# Patient Record
Sex: Female | Born: 1975 | Hispanic: No | Marital: Married | Smoking: Never smoker
Health system: Southern US, Community
[De-identification: ages and names within clinical notes are randomized; demographics above are authoritative.]

## PROBLEM LIST (undated history)

## (undated) DIAGNOSIS — N390 Urinary tract infection, site not specified: Secondary | ICD-10-CM

## (undated) DIAGNOSIS — Z915 Personal history of self-harm: Secondary | ICD-10-CM

---

## 1997-05-23 ENCOUNTER — Other Ambulatory Visit: Admission: RE | Admit: 1997-05-23 | Discharge: 1997-05-23 | Payer: Self-pay | Admitting: Gynecology

## 1997-06-29 ENCOUNTER — Encounter: Admission: RE | Admit: 1997-06-29 | Discharge: 1997-09-27 | Payer: Self-pay | Admitting: Gynecology

## 1997-09-30 ENCOUNTER — Ambulatory Visit (HOSPITAL_COMMUNITY): Admission: RE | Admit: 1997-09-30 | Discharge: 1997-09-30 | Payer: Self-pay | Admitting: Gynecology

## 1997-11-19 ENCOUNTER — Inpatient Hospital Stay (HOSPITAL_COMMUNITY): Admission: AD | Admit: 1997-11-19 | Discharge: 1997-11-22 | Payer: Self-pay | Admitting: Gynecology

## 1997-11-30 ENCOUNTER — Inpatient Hospital Stay (HOSPITAL_COMMUNITY): Admission: AD | Admit: 1997-11-30 | Discharge: 1997-11-30 | Payer: Self-pay | Admitting: Gynecology

## 1997-11-30 ENCOUNTER — Ambulatory Visit (HOSPITAL_COMMUNITY): Admission: RE | Admit: 1997-11-30 | Discharge: 1997-11-30 | Payer: Self-pay | Admitting: Urology

## 1998-02-07 ENCOUNTER — Other Ambulatory Visit: Admission: RE | Admit: 1998-02-07 | Discharge: 1998-02-07 | Payer: Self-pay | Admitting: Obstetrics and Gynecology

## 2001-08-09 ENCOUNTER — Emergency Department (HOSPITAL_COMMUNITY): Admission: EM | Admit: 2001-08-09 | Discharge: 2001-08-09 | Payer: Self-pay | Admitting: Emergency Medicine

## 2001-08-09 ENCOUNTER — Encounter: Payer: Self-pay | Admitting: Emergency Medicine

## 2005-06-26 ENCOUNTER — Encounter: Payer: Self-pay | Admitting: Urology

## 2006-11-17 ENCOUNTER — Emergency Department: Payer: Self-pay | Admitting: Emergency Medicine

## 2009-08-30 ENCOUNTER — Inpatient Hospital Stay (HOSPITAL_COMMUNITY): Admission: EM | Admit: 2009-08-30 | Discharge: 2009-09-03 | Payer: Self-pay | Admitting: Emergency Medicine

## 2009-08-30 DIAGNOSIS — Z8639 Personal history of other endocrine, nutritional and metabolic disease: Secondary | ICD-10-CM

## 2009-08-30 DIAGNOSIS — N12 Tubulo-interstitial nephritis, not specified as acute or chronic: Secondary | ICD-10-CM | POA: Insufficient documentation

## 2009-08-30 DIAGNOSIS — Z862 Personal history of diseases of the blood and blood-forming organs and certain disorders involving the immune mechanism: Secondary | ICD-10-CM

## 2009-09-02 LAB — CONVERTED CEMR LAB
HCT: 29.5 %
Hemoglobin: 10.3 g/dL
Hep B C IgM: NEGATIVE
Hepatitis B Surface Ag: NEGATIVE
MCHC: 32.1 g/dL
MCV: 91.7 fL
Platelets: 210 10*3/uL
RBC: 3.22 M/uL
RDW: 13.8 %
WBC: 7.6 10*3/uL

## 2009-09-03 LAB — CONVERTED CEMR LAB
AST: 39 units/L
Albumin: 2.8 g/dL
Bilirubin, Direct: 0.1 mg/dL
CO2: 26 meq/L
Glucose, Bld: 99 mg/dL
Potassium: 4.2 meq/L
Sodium: 140 meq/L
Total Bilirubin: 0.5 mg/dL

## 2009-09-15 ENCOUNTER — Encounter (INDEPENDENT_AMBULATORY_CARE_PROVIDER_SITE_OTHER): Payer: Self-pay | Admitting: Nurse Practitioner

## 2009-09-15 ENCOUNTER — Ambulatory Visit: Payer: Self-pay | Admitting: Internal Medicine

## 2009-09-15 DIAGNOSIS — E871 Hypo-osmolality and hyponatremia: Secondary | ICD-10-CM

## 2009-09-23 ENCOUNTER — Telehealth (INDEPENDENT_AMBULATORY_CARE_PROVIDER_SITE_OTHER): Payer: Self-pay | Admitting: Nurse Practitioner

## 2009-09-23 LAB — CONVERTED CEMR LAB
ALT: 24 units/L (ref 0–35)
AST: 24 units/L (ref 0–37)
Albumin: 4.1 g/dL (ref 3.5–5.2)
BUN: 7 mg/dL (ref 6–23)
CO2: 27 meq/L (ref 19–32)
Calcium: 9.2 mg/dL (ref 8.4–10.5)
Chloride: 104 meq/L (ref 96–112)
Creatinine, Ser: 0.69 mg/dL (ref 0.40–1.20)
HCV Ab: REACTIVE — AB
INR: 1.08 (ref <1.50)
Potassium: 4.5 meq/L (ref 3.5–5.3)
Prothrombin Time: 13.9 s (ref 11.6–15.2)

## 2010-03-27 NOTE — Assessment & Plan Note (Signed)
Summary: NEW - Establish care   Vital Signs:  Patient profile:   35 year old female LMP:     09/14/2009 Height:      60..25 inches Weight:      106.5 pounds Temp:     98.1 degrees F oral Pulse rate:   67 / minute Pulse rhythm:   regular Resp:     16 per minute BP sitting:   104 / 70  (left arm) Cuff size:   large  Vitals Entered By: Levon Hedger (September 15, 2009 3:30 PM) CC: follow-up visit [facility] Is Patient Diabetic? No Pain Assessment Patient in pain? no       Does patient need assistance? Functional Status Self care Ambulation Normal LMP (date): 09/14/2009     Enter LMP: 09/14/2009   CC:  follow-up visit [facility] [place].  History of Present Illness:  Pt into the office for hospital f/u Hospitalized from 08/30/2009 to 09/04/2009  no significant PMH no significant PSH  Pt reports that she presented to the ER with fever, nausea and vomiting.   CT scan showed enlarged left kidney and some dilation of the renal pelvis system, but no obvious stone. Pt was started on pyelonephritis.  Blood pressure dropped down into the 70's. She was started on zosyn and IV resuscitation.  Urine culture grew E. coli which is sensitive Cipro . She has taken cipro for 2 weeks and just finished her course on yesterday. Menses started on yesterday.  LFT"s elevated - ultrasound negative for cholecystitis. Alk phos is still elevated at 354 Hepatitis A negative Hepatits B negative Hepatitis C REACTIVE Consult with [facility] - Dr. Lyndal Rainbow and he recommended follow hepatitis panel and AMA outpt  Mitochondrial M2 Ab, IgG                 Negative  Pt is married for 5 years Mother has a hx of hepatitis B  when pt was in the 6th grade.  She was treated.  Family did NOT get vaccines. no tattoos no blood transfusion no IV drug use +nausea intermittently when eating fried and fatty foods no hx of jaundice  Habits & Providers  Alcohol-Tobacco-Diet     Alcohol drinks/day: 0     Tobacco Status:  never  Exercise-Depression-Behavior     Drug Use: never  Allergies (verified): 1)  ! * Vitamin C 2)  ! * Peanuts  Past History:  Past Surgical History: tubal ligation 2005  Family History: Father - healthy Mother - healthy  Social History: Originally from [place] 2 children married tobacco - none ETOH - none Drug - noneSmoking Status:  never Drug Use:  never  Review of Systems General:  Denies fever. CV:  Denies chest pain or discomfort. Resp:  Denies cough. GI:  Complains of nausea; denies abdominal pain and vomiting; especially when eating fried foods.  Physical Exam  General:  alert.  thin Head:  normocephalic.   Lungs:  normal breath sounds.   Heart:  normal rate.   Abdomen:  normal bowel sounds.   Msk:  normal ROM.   Neurologic:  alert & oriented X3.     Impression & Recommendations:  Problem # 1:  PYELONEPHRITIS (ICD-590.80) improved - pt has finished cipro unable to re-check urine today as pt is on her menses Orders: UA Dipstick w/o Micro (manual) (60454)  Problem # 2:  LIVER FUNCTION TESTS, ABNORMAL, HX OF (ICD-V12.2) Hepatitis C reactive in hospital will check additional labs today no know hx of the  disease Orders: T-Comprehensive Metabolic Panel [id number]) T-Hepatitis C Antibody ([id number]) T-Hepatitis C Viral Load [id number]) T-Hepattis C Genotype, DNA ([id number]) T-Protime, Auto ([id number])  Patient Instructions: 1)  Schedule an eligibility appointment 2)  Schedule follow up appointment after eligibility. 3)  Your initial hepatitis C lab done in the hospital was abnormal.  Will do additional labs today in office to determine if you have the infection.   4)   Mitochondrial Negative (no further workup needed) 5)  Will not recheck urine today since you are on your cycle   CT of Abdomen  Procedure date:  07/31/2009  Findings:      diffuse enlargment of left kidney with mild associated pelviectasis and distention of the  renal pelvis.  No obstructing ureteral density, urinary tract calculus demostrated  Renal US  Procedure date:  08/30/2009  Findings:      Asymmetric kidney size, left kidney enlarged maybe hyperemic; diffuse wall thickening of the gallbladder without stones; small amount of free fluid in the right upper quadrant and fluid in the right lower quadrant   CT of Abdomen  Procedure date:  07/31/2009  Findings:      diffuse enlargment of left kidney with mild associated pelviectasis and distention of the renal pelvis.  No obstructing ureteral density, urinary tract calculus demostrated  Renal US  Procedure date:  08/30/2009  Findings:      Asymmetric kidney size, left kidney enlarged maybe hyperemic; diffuse wall thickening of the gallbladder without stones; small amount of free fluid in the right upper quadrant and fluid in the right lower quadrant   Laboratory Results  Comments: pt did not give a urine sample because she is on her menses

## 2010-03-27 NOTE — Letter (Signed)
Summary: PT INFORMATION SHEET  PT INFORMATION SHEET   Imported By: Arta Bruce 09/18/2009 14:59:21  _____________________________________________________________________  External Attachment:    Type:   Image     Comment:   External Document

## 2010-03-27 NOTE — Progress Notes (Signed)
Summary: Needs eligibilty appt  Phone Note Outgoing Call   Summary of Call: does this pt have an eligibility appt? if not, she needs to be contacted and scheduled for one because she needs a f/u appt with me I will need to see her for review of labs once she has on orange card be sure she has a list of what she needs to take with her to the eligibility appt Initial call taken by: Lehman Prom FNP,  September 23, 2009 6:41 PM  Follow-up for Phone Call        left message with person that answered to get a message to the pt to return call to the offfice because pt is scheduled on 10-04-09 at 10:45 for eligibility and needs to be made aware of this date and time.   Levon Hedger  September 26, 2009 3:48 PM  Levon Hedger  October 02, 2009 10:36 AM Left message on machcine for pt to return call to the office.  Levon Hedger  October 02, 2009 5:25 PM per Selena Batten pt informed of above information.

## 2010-05-13 LAB — CARDIAC PANEL(CRET KIN+CKTOT+MB+TROPI)
CK, MB: 0.4 ng/mL (ref 0.3–4.0)
CK, MB: 0.5 ng/mL (ref 0.3–4.0)
CK, MB: 0.5 ng/mL (ref 0.3–4.0)
CK, MB: 0.5 ng/mL (ref 0.3–4.0)
Relative Index: INVALID (ref 0.0–2.5)
Relative Index: INVALID (ref 0.0–2.5)
Total CK: 44 U/L (ref 7–177)
Troponin I: 0.01 ng/mL (ref 0.00–0.06)

## 2010-05-13 LAB — URINALYSIS, ROUTINE W REFLEX MICROSCOPIC
Glucose, UA: NEGATIVE mg/dL
Protein, ur: 100 mg/dL — AB
pH: 5.5 (ref 5.0–8.0)

## 2010-05-13 LAB — DIFFERENTIAL
Basophils Absolute: 0 10*3/uL (ref 0.0–0.1)
Basophils Relative: 0 % (ref 0–1)
Basophils Relative: 0 % (ref 0–1)
Eosinophils Absolute: 0 10*3/uL (ref 0.0–0.7)
Eosinophils Relative: 0 % (ref 0–5)
Monocytes Absolute: 0.3 10*3/uL (ref 0.1–1.0)
Monocytes Relative: 5 % (ref 3–12)
Neutro Abs: 5.6 10*3/uL (ref 1.7–7.7)
Neutro Abs: 5.7 10*3/uL (ref 1.7–7.7)
Neutrophils Relative %: 85 % — ABNORMAL HIGH (ref 43–77)

## 2010-05-13 LAB — COMPREHENSIVE METABOLIC PANEL
ALT: 82 U/L — ABNORMAL HIGH (ref 0–35)
AST: 38 U/L — ABNORMAL HIGH (ref 0–37)
Alkaline Phosphatase: 252 U/L — ABNORMAL HIGH (ref 39–117)
CO2: 26 mEq/L (ref 19–32)
Chloride: 111 mEq/L (ref 96–112)
Creatinine, Ser: 0.67 mg/dL (ref 0.4–1.2)
GFR calc Af Amer: >60 mL/min (ref >60)
GFR calc non Af Amer: >60 mL/min (ref >60)
Potassium: 4.2 mEq/L (ref 3.5–5.1)
Sodium: 140 mEq/L (ref 135–145)
Total Bilirubin: 0.6 mg/dL (ref 0.3–1.2)

## 2010-05-13 LAB — URINE CULTURE: Colony Count: >=100000

## 2010-05-13 LAB — BASIC METABOLIC PANEL
BUN: 3 mg/dL — ABNORMAL LOW (ref 6–23)
BUN: 6 mg/dL (ref 6–23)
CO2: 25 mEq/L (ref 19–32)
CO2: 30 mEq/L (ref 19–32)
Calcium: 6.7 mg/dL — ABNORMAL LOW (ref 8.4–10.5)
Chloride: 95 mEq/L — ABNORMAL LOW (ref 96–112)
GFR calc non Af Amer: >60 mL/min (ref >60)
GFR calc non Af Amer: >60 mL/min (ref >60)
Glucose, Bld: 127 mg/dL — ABNORMAL HIGH (ref 70–99)
Glucose, Bld: 93 mg/dL (ref 70–99)
Glucose, Bld: 98 mg/dL (ref 70–99)
Potassium: 2.6 mEq/L — CL (ref 3.5–5.1)
Potassium: 3.5 mEq/L (ref 3.5–5.1)
Sodium: 131 mEq/L — ABNORMAL LOW (ref 135–145)
Sodium: 138 mEq/L (ref 135–145)
Sodium: 140 mEq/L (ref 135–145)

## 2010-05-13 LAB — URINE MICROSCOPIC-ADD ON

## 2010-05-13 LAB — HEPATIC FUNCTION PANEL
ALT: 85 U/L — ABNORMAL HIGH (ref 0–35)
AST: 48 U/L — ABNORMAL HIGH (ref 0–37)
Albumin: 2.2 g/dL — ABNORMAL LOW (ref 3.5–5.2)
Albumin: 2.8 g/dL — ABNORMAL LOW (ref 3.5–5.2)
Alkaline Phosphatase: 354 U/L — ABNORMAL HIGH (ref 39–117)
Total Protein: 4.6 g/dL — ABNORMAL LOW (ref 6.0–8.3)
Total Protein: 6.8 g/dL (ref 6.0–8.3)

## 2010-05-13 LAB — LIPASE, BLOOD: Lipase: 27 U/L (ref 11–59)

## 2010-05-13 LAB — CBC
HCT: 31.2 % — ABNORMAL LOW (ref 36.0–46.0)
HCT: 37.3 % (ref 36.0–46.0)
Hemoglobin: 10.3 g/dL — ABNORMAL LOW (ref 12.0–15.0)
Hemoglobin: 10.6 g/dL — ABNORMAL LOW (ref 12.0–15.0)
Hemoglobin: 12.7 g/dL (ref 12.0–15.0)
MCH: 31.4 pg (ref 26.0–34.0)
MCH: 31.6 pg (ref 26.0–34.0)
MCH: 32.1 pg (ref 26.0–34.0)
MCHC: 34 g/dL (ref 30.0–36.0)
MCHC: 34 g/dL (ref 30.0–36.0)
MCHC: 34.1 g/dL (ref 30.0–36.0)
MCV: 92.9 fL (ref 78.0–100.0)
RBC: 3.22 MIL/uL — ABNORMAL LOW (ref 3.87–5.11)
RDW: 13.8 % (ref 11.5–15.5)
RDW: 14.3 % (ref 11.5–15.5)
WBC: 7.6 10*3/uL (ref 4.0–10.5)

## 2010-05-13 LAB — MRSA PCR SCREENING: MRSA by PCR: NEGATIVE

## 2010-05-13 LAB — CULTURE, BLOOD (ROUTINE X 2): Culture: NO GROWTH

## 2010-05-13 LAB — MAGNESIUM: Magnesium: 1.6 mg/dL (ref 1.5–2.5)

## 2011-01-01 ENCOUNTER — Encounter: Payer: Self-pay | Admitting: Emergency Medicine

## 2011-01-01 ENCOUNTER — Emergency Department (HOSPITAL_COMMUNITY)
Admission: EM | Admit: 2011-01-01 | Discharge: 2011-01-01 | Disposition: A | Discharge status: Home / Self Care | Payer: Self-pay | Attending: Emergency Medicine | Admitting: Emergency Medicine

## 2011-01-01 DIAGNOSIS — N12 Tubulo-interstitial nephritis, not specified as acute or chronic: Secondary | ICD-10-CM | POA: Insufficient documentation

## 2011-01-01 DIAGNOSIS — R300 Dysuria: Secondary | ICD-10-CM | POA: Insufficient documentation

## 2011-01-01 DIAGNOSIS — R350 Frequency of micturition: Secondary | ICD-10-CM | POA: Insufficient documentation

## 2011-01-01 DIAGNOSIS — R10819 Abdominal tenderness, unspecified site: Secondary | ICD-10-CM | POA: Insufficient documentation

## 2011-01-01 DIAGNOSIS — R112 Nausea with vomiting, unspecified: Secondary | ICD-10-CM | POA: Insufficient documentation

## 2011-01-01 DIAGNOSIS — R6883 Chills (without fever): Secondary | ICD-10-CM | POA: Insufficient documentation

## 2011-01-01 DIAGNOSIS — R109 Unspecified abdominal pain: Secondary | ICD-10-CM | POA: Insufficient documentation

## 2011-01-01 HISTORY — DX: Urinary tract infection, site not specified: N39.0

## 2011-01-01 LAB — DIFFERENTIAL
Eosinophils Absolute: 0 10*3/uL (ref 0.0–0.7)
Lymphocytes Relative: 9 % — ABNORMAL LOW (ref 12–46)
Lymphs Abs: 0.8 10*3/uL (ref 0.7–4.0)
Monocytes Relative: 6 % (ref 3–12)
Neutrophils Relative %: 85 % — ABNORMAL HIGH (ref 43–77)

## 2011-01-01 LAB — URINE MICROSCOPIC-ADD ON

## 2011-01-01 LAB — URINALYSIS, ROUTINE W REFLEX MICROSCOPIC
Bilirubin Urine: NEGATIVE
Nitrite: NEGATIVE
Specific Gravity, Urine: 1.02 (ref 1.005–1.030)
pH: 7.5 (ref 5.0–8.0)

## 2011-01-01 LAB — POCT I-STAT, CHEM 8
BUN: 3 mg/dL — ABNORMAL LOW (ref 6–23)
Chloride: 98 mEq/L (ref 96–112)
Creatinine, Ser: 0.8 mg/dL (ref 0.50–1.10)
Sodium: 134 mEq/L — ABNORMAL LOW (ref 135–145)
TCO2: 23 mmol/L (ref 0–100)

## 2011-01-01 LAB — CBC
Hemoglobin: 13.3 g/dL (ref 12.0–15.0)
MCH: 30.6 pg (ref 26.0–34.0)
MCV: 89.4 fL (ref 78.0–100.0)
RBC: 4.35 MIL/uL (ref 3.87–5.11)
WBC: 9 10*3/uL (ref 4.0–10.5)

## 2011-01-01 LAB — PREGNANCY, URINE: Preg Test, Ur: NEGATIVE

## 2011-01-01 MED ORDER — DEXTROSE 5 % IV SOLN
1.0000 g | Freq: Once | INTRAVENOUS | Status: AC
  Administered 2011-01-01: 1 g via INTRAVENOUS
  Filled 2011-01-01: qty 10

## 2011-01-01 MED ORDER — CIPROFLOXACIN HCL 500 MG PO TABS: 500.0000 mg | ORAL_TABLET | Freq: Two times a day (BID) | ORAL | Status: AC

## 2011-01-01 MED ORDER — HYDROCODONE-ACETAMINOPHEN 5-500 MG PO TABS: 1.0000 | ORAL_TABLET | Freq: Four times a day (QID) | ORAL | Status: AC | PRN

## 2011-01-01 MED ORDER — CEPHALEXIN 500 MG PO CAPS: 500.0000 mg | ORAL_CAPSULE | Freq: Four times a day (QID) | ORAL | Status: AC

## 2011-01-01 MED ORDER — FENTANYL CITRATE 0.05 MG/ML IJ SOLN
50.0000 ug | Freq: Once | INTRAMUSCULAR | Status: AC
  Administered 2011-01-01: 50 ug via INTRAVENOUS
  Filled 2011-01-01: qty 2

## 2011-01-01 MED ORDER — ONDANSETRON HCL 4 MG/2ML IJ SOLN
4.0000 mg | Freq: Once | INTRAMUSCULAR | Status: AC
  Administered 2011-01-01: 4 mg via INTRAVENOUS
  Filled 2011-01-01: qty 2

## 2011-01-01 MED ORDER — SODIUM CHLORIDE 0.9 % IV BOLUS (SEPSIS)
1000.0000 mL | Freq: Once | INTRAVENOUS | Status: AC
  Administered 2011-01-01: 1000 mL via INTRAVENOUS

## 2011-01-01 MED ORDER — ACETAMINOPHEN 500 MG PO TABS
1000.0000 mg | ORAL_TABLET | Freq: Once | ORAL | Status: AC
  Administered 2011-01-01: 1000 mg via ORAL
  Filled 2011-01-01: qty 1

## 2011-01-01 MED ORDER — SODIUM CHLORIDE 0.9 % IV BOLUS (SEPSIS)
1000.0000 mL | Freq: Once | INTRAVENOUS | Status: AC
  Administered 2011-01-01 (×2): 1000 mL via INTRAVENOUS

## 2011-01-01 NOTE — ED Notes (Signed)
Pt temp elevated: 102 F oral

## 2011-01-01 NOTE — ED Provider Notes (Deleted)
History     CSN: [id number] Arrival date & time: 01/01/2011 12:59 PM   First MD Initiated Contact with Patient 01/01/11 1327      Chief Complaint  Patient presents with  . Urinary Frequency  . Urinary Tract Infection    (Consider location/radiation/quality/duration/timing/severity/associated sxs/prior treatment) HPI  Past Medical History  Diagnosis Date  . UTI (urinary tract infection)     History reviewed. No pertinent past surgical history.  No family history on file.  History  Substance Use Topics  . Smoking status: Not on file  . Smokeless tobacco: Not on file  . Alcohol Use: Yes    OB History    Grav Para Term Preterm Abortions TAB SAB Ect Mult Living                  Review of Systems  Allergies  Peanut-containing drug products  Home Medications   Current Outpatient Rx  Name Route Sig Dispense Refill  . ACETAMINOPHEN 500 MG PO TABS Oral Take 1,000 mg by mouth every 6 (six) hours as needed. For pain     . THERA M PLUS PO TABS Oral Take 1 tablet by mouth 2 (two) times daily.      . CEPHALEXIN 500 MG PO CAPS Oral Take 1 capsule (500 mg total) by mouth 4 (four) times daily. 40 capsule 0  . CIPROFLOXACIN HCL 500 MG PO TABS Oral Take 1 tablet (500 mg total) by mouth 2 (two) times daily. 20 tablet 0  . HYDROCODONE-ACETAMINOPHEN 5-500 MG PO TABS Oral Take 1-2 tablets by mouth every 6 (six) hours as needed for pain. 15 tablet 0    BP 92/54  Pulse 106  Temp(Src) 98.7 F (37.1 C) (Oral)  Resp 20  Wt 108 lb (48.988 kg)  SpO2 100%  Physical Exam  ED Course  Procedures (including critical care time)  Labs Reviewed  URINALYSIS, ROUTINE W REFLEX MICROSCOPIC - Abnormal; Notable for the following:    Appearance CLOUDY (*)    Hgb urine dipstick SMALL (*)    Protein, ur 100 (*)    Leukocytes, UA LARGE (*)    All other components within normal limits  DIFFERENTIAL - Abnormal; Notable for the following:    Neutrophils Relative 85 (*)    Lymphocytes  Relative 9 (*)    All other components within normal limits  POCT I-STAT, CHEM 8 - Abnormal; Notable for the following:    Sodium 134 (*)    BUN 3 (*)    Glucose, Bld 117 (*)    All other components within normal limits  URINE MICROSCOPIC-ADD ON - Abnormal; Notable for the following:    Squamous Epithelial / LPF FEW (*)    Bacteria, UA FEW (*)    All other components within normal limits  CBC  PREGNANCY, URINE  I-STAT, CHEM 8   No results found.   1. Pyelonephritis       MDM  Pyelonephritis. Pt treated and improved in ED.Flint Melter, MD 01/01/11 2037  Flint Melter, MD 01/01/11 2039

## 2011-01-01 NOTE — ED Notes (Signed)
Pt reports frequent UTIs and began having frequency, urgency, pain with urination on Sunday. Yesterday began having nausea, fever of 101 at home. Denies blood in urine. No treatment at home.

## 2011-01-01 NOTE — ED Notes (Signed)
Pt states that she has a lot of uti and that she has lower back pain, n/v, fever for the past 2 days
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
## 2011-01-04 NOTE — ED Provider Notes (Signed)
Medical screening examination/treatment/procedure(s) were performed by non-physician practitioner and as supervising physician I was immediately available for consultation/collaboration.  Flint Melter, MD 01/04/11 [phone]

## 2011-02-01 ENCOUNTER — Other Ambulatory Visit (HOSPITAL_COMMUNITY): Payer: Self-pay | Admitting: Urology

## 2011-02-01 DIAGNOSIS — N390 Urinary tract infection, site not specified: Secondary | ICD-10-CM

## 2011-02-04 ENCOUNTER — Inpatient Hospital Stay (HOSPITAL_COMMUNITY): Admission: RE | Admit: 2011-02-04 | Payer: Self-pay | Source: Ambulatory Visit

## 2011-04-26 ENCOUNTER — Emergency Department: Payer: Self-pay | Admitting: Emergency Medicine

## 2011-04-26 LAB — CBC WITH DIFFERENTIAL/PLATELET
Basophil %: 1.6 %
Eosinophil #: 0.2 10*3/uL (ref 0.0–0.7)
Eosinophil %: 3.1 %
HGB: 13.4 g/dL (ref 12.0–16.0)
Lymphocyte %: 25 %
Neutrophil %: 61 %
RBC: 4.43 10*6/uL (ref 3.80–5.20)

## 2011-04-26 LAB — COMPREHENSIVE METABOLIC PANEL
Anion Gap: 11 (ref 7–16)
Calcium, Total: 9.1 mg/dL (ref 8.5–10.1)
Chloride: 104 mmol/L (ref 98–107)
Co2: 27 mmol/L (ref 21–32)
EGFR (African American): >60
EGFR (Non-African Amer.): >60
Glucose: 90 mg/dL (ref 65–99)
Potassium: 3.8 mmol/L (ref 3.5–5.1)
SGOT(AST): 93 U/L — ABNORMAL HIGH (ref 15–37)

## 2011-04-26 LAB — PROTIME-INR
INR: 1
Prothrombin Time: 13.2 secs (ref 11.5–14.7)

## 2011-04-26 LAB — APTT: Activated PTT: 26.3 secs (ref 23.6–35.9)
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
## 2014-01-25 DIAGNOSIS — Z9151 Personal history of suicidal behavior: Secondary | ICD-10-CM

## 2014-01-25 HISTORY — DX: Personal history of suicidal behavior: Z91.51

## 2014-10-03 ENCOUNTER — Encounter (HOSPITAL_COMMUNITY): Payer: Self-pay | Admitting: Emergency Medicine

## 2014-10-03 ENCOUNTER — Emergency Department (HOSPITAL_COMMUNITY)
Admission: EM | Admit: 2014-10-03 | Discharge: 2014-10-04 | Disposition: A | Discharge status: Other Acute Inpatient Hospital | Payer: Self-pay | Attending: Emergency Medicine | Admitting: Emergency Medicine

## 2014-10-03 DIAGNOSIS — T6092XA Toxic effect of unspecified pesticide, intentional self-harm, initial encounter: Secondary | ICD-10-CM | POA: Insufficient documentation

## 2014-10-03 DIAGNOSIS — Y998 Other external cause status: Secondary | ICD-10-CM | POA: Insufficient documentation

## 2014-10-03 DIAGNOSIS — Z3202 Encounter for pregnancy test, result negative: Secondary | ICD-10-CM | POA: Insufficient documentation

## 2014-10-03 DIAGNOSIS — F10921 Alcohol use, unspecified with intoxication delirium: Secondary | ICD-10-CM

## 2014-10-03 DIAGNOSIS — T405X4A Poisoning by cocaine, undetermined, initial encounter: Secondary | ICD-10-CM | POA: Insufficient documentation

## 2014-10-03 DIAGNOSIS — T50904A Poisoning by unspecified drugs, medicaments and biological substances, undetermined, initial encounter: Secondary | ICD-10-CM

## 2014-10-03 DIAGNOSIS — Y92009 Unspecified place in unspecified non-institutional (private) residence as the place of occurrence of the external cause: Secondary | ICD-10-CM | POA: Insufficient documentation

## 2014-10-03 DIAGNOSIS — T1491 Suicide attempt: Secondary | ICD-10-CM | POA: Insufficient documentation

## 2014-10-03 DIAGNOSIS — T6591XA Toxic effect of unspecified substance, accidental (unintentional), initial encounter: Secondary | ICD-10-CM

## 2014-10-03 DIAGNOSIS — F10121 Alcohol abuse with intoxication delirium: Secondary | ICD-10-CM | POA: Insufficient documentation

## 2014-10-03 DIAGNOSIS — T424X4A Poisoning by benzodiazepines, undetermined, initial encounter: Secondary | ICD-10-CM | POA: Insufficient documentation

## 2014-10-03 DIAGNOSIS — Z87440 Personal history of urinary (tract) infections: Secondary | ICD-10-CM | POA: Insufficient documentation

## 2014-10-03 DIAGNOSIS — Y9389 Activity, other specified: Secondary | ICD-10-CM | POA: Insufficient documentation

## 2014-10-03 DIAGNOSIS — T1491XA Suicide attempt, initial encounter: Secondary | ICD-10-CM

## 2014-10-03 HISTORY — DX: Personal history of self-harm: Z91.5

## 2014-10-03 LAB — COMPREHENSIVE METABOLIC PANEL
ALT: 35 U/L (ref 14–54)
AST: 29 U/L (ref 15–41)
Albumin: 3.6 g/dL (ref 3.5–5.0)
Alkaline Phosphatase: 89 U/L (ref 38–126)
Anion gap: 9 (ref 5–15)
BUN: <5 mg/dL — ABNORMAL LOW (ref 6–20)
CO2: 26 mmol/L (ref 22–32)
CREATININE: 0.81 mg/dL (ref 0.44–1.00)
Calcium: 8.8 mg/dL — ABNORMAL LOW (ref 8.9–10.3)
Chloride: 107 mmol/L (ref 101–111)
Glucose, Bld: 98 mg/dL (ref 65–99)
POTASSIUM: 3.4 mmol/L — AB (ref 3.5–5.1)
SODIUM: 142 mmol/L (ref 135–145)
Total Bilirubin: 0.5 mg/dL (ref 0.3–1.2)
Total Protein: 6.9 g/dL (ref 6.5–8.1)

## 2014-10-03 LAB — I-STAT ARTERIAL BLOOD GAS, ED
Acid-base deficit: 2 mmol/L (ref 0.0–2.0)
Bicarbonate: 24.4 mEq/L — ABNORMAL HIGH (ref 20.0–24.0)
O2 SAT: 100 %
PH ART: 7.35 (ref 7.350–7.450)
PO2 ART: 295 mmHg — AB (ref 80.0–100.0)
Patient temperature: 98.1
TCO2: 26 mmol/L (ref 0–100)
pCO2 arterial: 44 mmHg (ref 35.0–45.0)

## 2014-10-03 LAB — CBC WITH DIFFERENTIAL/PLATELET
Basophils Absolute: 0 10*3/uL (ref 0.0–0.1)
Basophils Relative: 0 % (ref 0–1)
EOS ABS: 0.1 10*3/uL (ref 0.0–0.7)
Eosinophils Relative: 2 % (ref 0–5)
HEMATOCRIT: 40.9 % (ref 36.0–46.0)
Hemoglobin: 14 g/dL (ref 12.0–15.0)
Lymphocytes Relative: 55 % — ABNORMAL HIGH (ref 12–46)
Lymphs Abs: 2.2 10*3/uL (ref 0.7–4.0)
MCH: 29.5 pg (ref 26.0–34.0)
MCHC: 34.2 g/dL (ref 30.0–36.0)
MCV: 86.1 fL (ref 78.0–100.0)
MONOS PCT: 9 % (ref 3–12)
Monocytes Absolute: 0.3 10*3/uL (ref 0.1–1.0)
NEUTROS ABS: 1.4 10*3/uL — AB (ref 1.7–7.7)
Neutrophils Relative %: 35 % — ABNORMAL LOW (ref 43–77)
Platelets: 260 10*3/uL (ref 150–400)
RBC: 4.75 MIL/uL (ref 3.87–5.11)
RDW: 15.1 % (ref 11.5–15.5)
WBC: 3.9 10*3/uL — ABNORMAL LOW (ref 4.0–10.5)

## 2014-10-03 LAB — URINALYSIS, ROUTINE W REFLEX MICROSCOPIC
BILIRUBIN URINE: NEGATIVE
Glucose, UA: NEGATIVE mg/dL
Hgb urine dipstick: NEGATIVE
KETONES UR: NEGATIVE mg/dL
Nitrite: POSITIVE — AB
PROTEIN: NEGATIVE mg/dL
SPECIFIC GRAVITY, URINE: 1.01 (ref 1.005–1.030)
UROBILINOGEN UA: 0.2 mg/dL (ref 0.0–1.0)
pH: 5.5 (ref 5.0–8.0)

## 2014-10-03 LAB — RAPID URINE DRUG SCREEN, HOSP PERFORMED
Amphetamines: NOT DETECTED
Barbiturates: NOT DETECTED
Benzodiazepines: POSITIVE — AB
Cocaine: NOT DETECTED
Opiates: NOT DETECTED
Tetrahydrocannabinol: POSITIVE — AB

## 2014-10-03 LAB — URINE MICROSCOPIC-ADD ON

## 2014-10-03 LAB — POC URINE PREG, ED: PREG TEST UR: NEGATIVE

## 2014-10-03 LAB — CBG MONITORING, ED: Glucose-Capillary: 73 mg/dL (ref 65–99)

## 2014-10-03 LAB — ETHANOL: ALCOHOL ETHYL (B): 180 mg/dL — AB (ref <5)

## 2014-10-03 LAB — ACETAMINOPHEN LEVEL: Acetaminophen (Tylenol), Serum: <10 ug/mL — ABNORMAL LOW (ref 10–30)

## 2014-10-03 LAB — SALICYLATE LEVEL: Salicylate Lvl: <4 mg/dL (ref 2.8–30.0)

## 2014-10-03 MED ORDER — NALOXONE HCL 0.4 MG/ML IJ SOLN
INTRAMUSCULAR | Status: AC
Start: 2014-10-03 — End: 2014-10-03
  Administered 2014-10-03: 0.4 mg
  Filled 2014-10-03: qty 1
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
## 2014-10-03 NOTE — ED Provider Notes (Signed)
Seen on arrival Level V caveat history is taken from EMS. EMS reports patient ingested raid, "licked to the Can" prior to coming here and drank one bottle of wine and attempt to harm herself. EMS arrived to find patient unresponsive. Pulse oximetry on room air dropped to 83% in EMS present. EMS treated patient with nasal trumpet and supplemental oxygen. On exam patient is arousable to tactile stimulus. Follow some simple commands. Glasgow coma score equals 8-9 Gag reflex present but weak. Moves all extremities pupils 2-3 mm, reactive to light. Skin warm dry no rash. Patient admits her Narcan, without improvement of level of alertness  11:50 PM patient is alert admits to drinking rate and effort to harm herself she has had suicide attempts in the past. She is alert to go, score 15 and relates without difficulty. Results for orders placed or performed during the hospital encounter of 10/03/14  CBC with Differential  Result Value Ref Range   WBC 3.9 (L) 4.0 - 10.5 K/uL   RBC 4.75 3.87 - 5.11 MIL/uL   Hemoglobin 14.0 12.0 - 15.0 g/dL   HCT 11.9 14.7 - 82.9 %   MCV 86.1 78.0 - 100.0 fL   MCH 29.5 26.0 - 34.0 pg   MCHC 34.2 30.0 - 36.0 g/dL   RDW 56.2 13.0 - 86.5 %   Platelets 260 150 - 400 K/uL   Neutrophils Relative % 35 (L) 43 - 77 %   Neutro Abs 1.4 (L) 1.7 - 7.7 K/uL   Lymphocytes Relative 55 (H) 12 - 46 %   Lymphs Abs 2.2 0.7 - 4.0 K/uL   Monocytes Relative 9 3 - 12 %   Monocytes Absolute 0.3 0.1 - 1.0 K/uL   Eosinophils Relative 2 0 - 5 %   Eosinophils Absolute 0.1 0.0 - 0.7 K/uL   Basophils Relative 0 0 - 1 %   Basophils Absolute 0.0 0.0 - 0.1 K/uL  Comprehensive metabolic panel  Result Value Ref Range   Sodium 142 135 - 145 mmol/L   Potassium 3.4 (L) 3.5 - 5.1 mmol/L   Chloride 107 101 - 111 mmol/L   CO2 26 22 - 32 mmol/L   Glucose, Bld 98 65 - 99 mg/dL   BUN <5 (L) 6 - 20 mg/dL   Creatinine, Ser 7.84 0.44 - 1.00 mg/dL   Calcium 8.8 (L) 8.9 - 10.3 mg/dL   Total Protein 6.9 6.5 -  8.1 g/dL   Albumin 3.6 3.5 - 5.0 g/dL   AST 29 15 - 41 U/L   ALT 35 14 - 54 U/L   Alkaline Phosphatase 89 38 - 126 U/L   Total Bilirubin 0.5 0.3 - 1.2 mg/dL   GFR calc non Af Amer >60 >60 mL/min   GFR calc Af Amer >60 >60 mL/min   Anion gap 9 5 - 15  Urinalysis, Routine w reflex microscopic  Result Value Ref Range   Color, Urine YELLOW YELLOW   APPearance CLEAR CLEAR   Specific Gravity, Urine 1.010 1.005 - 1.030   pH 5.5 5.0 - 8.0   Glucose, UA NEGATIVE NEGATIVE mg/dL   Hgb urine dipstick NEGATIVE NEGATIVE   Bilirubin Urine NEGATIVE NEGATIVE   Ketones, ur NEGATIVE NEGATIVE mg/dL   Protein, ur NEGATIVE NEGATIVE mg/dL   Urobilinogen, UA 0.2 0.0 - 1.0 mg/dL   Nitrite POSITIVE (A) NEGATIVE   Leukocytes, UA TRACE (A) NEGATIVE  Urine rapid drug screen (hosp performed)  Result Value Ref Range   Opiates NONE DETECTED NONE DETECTED  Cocaine NONE DETECTED NONE DETECTED   Benzodiazepines POSITIVE (A) NONE DETECTED   Amphetamines NONE DETECTED NONE DETECTED   Tetrahydrocannabinol POSITIVE (A) NONE DETECTED   Barbiturates NONE DETECTED NONE DETECTED  Ethanol  Result Value Ref Range   Alcohol, Ethyl (B) 180 (H) <5 mg/dL  Salicylate level  Result Value Ref Range   Salicylate Lvl <4.0 2.8 - 30.0 mg/dL  Acetaminophen level  Result Value Ref Range   Acetaminophen (Tylenol), Serum <10 (L) 10 - 30 ug/mL  Urine microscopic-add on  Result Value Ref Range   Squamous Epithelial / LPF RARE RARE   WBC, UA 3-6 <3 WBC/hpf   Bacteria, UA FEW (A) RARE  CBG monitoring, ED  Result Value Ref Range   Glucose-Capillary 73 65 - 99 mg/dL  POC urine preg, ED  Result Value Ref Range   Preg Test, Ur NEGATIVE NEGATIVE  I-Stat arterial blood gas, ED  Result Value Ref Range   pH, Arterial 7.350 7.350 - 7.450   pCO2 arterial 44.0 35.0 - 45.0 mmHg   pO2, Arterial 295.0 (H) 80.0 - 100.0 mmHg   Bicarbonate 24.4 (H) 20.0 - 24.0 mEq/L   TCO2 26 0 - 100 mmol/L   O2 Saturation 100.0 %   Acid-base deficit  2.0 0.0 - 2.0 mmol/L   Patient temperature 98.1 F    Collection site RADIAL, ALLEN'S TEST ACCEPTABLE    Drawn by Operator    Sample type ARTERIAL    No results found.  CRITICAL CARE Performed by: Doug Sou Total critical care time: 35 minute Critical care time was exclusive of separately billable procedures and treating other patients. Critical care was necessary to treat or prevent imminent or life-threatening deterioration. Critical care was time spent personally by me on the following activities: development of treatment plan with patient and/or surrogate as well as nursing, discussions with consultants, evaluation of patient's response to treatment, examination of patient, obtaining history from patient or surrogate, ordering and performing treatments and interventions, ordering and review of laboratory studies, ordering and review of radiographic studies, pulse oximetry and re-evaluation of patient's condition.  Doug Sou, MD 10/03/14 [phone]

## 2014-10-03 NOTE — ED Provider Notes (Signed)
History   Chief Complaint  Patient presents with  . Poisoning    HPI [age] female with past medical history of chronic psychiatric illness, prior suicide attempts, who presents to ED via EMS for evaluation of ingestion. Per EMS report, patient ingested raid at home and was also drinking alcohol. Unknown whether patient had other coingestants. EMS does not know patient's home medications. They report patient has been unresponsive since they picked her up. She's been hemodynamically stable until shortly before arriving to ED if patient desaturated to 82% they placed a NPA and nonrebreather with normalization of oxygenation. EMS reports patient has been drinking wine all day per report. History is limited at this time secondary to patient condition and no family members being present.  Past medical/surgical history, social history, medications, allergies and FH have been reviewed with patient and/or in documentation. Furthermore, if pt family or friend(s) present, additional historical information was obtained from them.  Past Medical History  Diagnosis Date  . UTI (urinary tract infection)    No past surgical history on file. No family history on file. History  Substance Use Topics  . Smoking status: Not on file  . Smokeless tobacco: Not on file  . Alcohol Use: Yes     Review of Systems Unable to obtain secondary to patient condition   Physical Exam  Physical Exam ED Triage Vitals  Enc Vitals Group     BP 10/03/14 2029 121/86 mmHg     Pulse Rate 10/03/14 2029 85     Resp 10/03/14 2029 23     Temp 10/03/14 2029 97.9 F (36.6 C)     Temp Source 10/03/14 2029 Oral     SpO2 10/03/14 2024 98 %     Weight 10/03/14 2029 108 lb (48.988 kg)     Height 10/03/14 2029  (1.626 m)     Head Cir --      Peak Flow --      Pain Score --      Pain Loc --      Pain Edu? --      Excl. in GC? --    Constitutional: pt somnolent on arrival, minimally responsive. Opens eyes to pain. NPA  and NRB in place. No obvious distress. Head: Normocephalic and atraumatic.  Eyes: Extraocular motion intact, no scleral icterus Mouth: MMM, OP clear Neck: Supple without meningismus, mass, or overt JVD Respiratory: No respiratory distress. Normal WOB. No w/r/g. CV: RRR, no obvious murmurs.  Pulses +2 and symmetric. Euvolemic Abdomen: Soft, NT, ND, no r/g. No mass.  MSK: Extremities are atraumatic without deformity, ROM intact Skin: Warm, dry, intact without rash Neuro: MAE, poor tone, no focal deficit noted.   ED Course  Procedures   Labs Reviewed  CBC WITH DIFFERENTIAL/PLATELET - Abnormal; Notable for the following:    WBC 3.9 (*)    Neutrophils Relative % 35 (*)    Neutro Abs 1.4 (*)    Lymphocytes Relative 55 (*)    All other components within normal limits  COMPREHENSIVE METABOLIC PANEL - Abnormal; Notable for the following:    Potassium 3.4 (*)    BUN <5 (*)    Calcium 8.8 (*)    All other components within normal limits  URINALYSIS, ROUTINE W REFLEX MICROSCOPIC (NOT AT [facility]) - Abnormal; Notable for the following:    Nitrite POSITIVE (*)    Leukocytes, UA TRACE (*)    All other components within normal limits  URINE RAPID DRUG SCREEN, HOSP PERFORMED -  Abnormal; Notable for the following:    Benzodiazepines POSITIVE (*)    Tetrahydrocannabinol POSITIVE (*)    All other components within normal limits  ETHANOL - Abnormal; Notable for the following:    Alcohol, Ethyl (B) 180 (*)    All other components within normal limits  ACETAMINOPHEN LEVEL - Abnormal; Notable for the following:    Acetaminophen (Tylenol), Serum <10 (*)    All other components within normal limits  URINE MICROSCOPIC-ADD ON - Abnormal; Notable for the following:    Bacteria, UA FEW (*)    All other components within normal limits  I-STAT ARTERIAL BLOOD GAS, ED - Abnormal; Notable for the following:    pO2, Arterial 295.0 (*)    Bicarbonate 24.4 (*)    All other components within normal limits   SALICYLATE LEVEL  BLOOD GAS, ARTERIAL  CBG MONITORING, ED  POC URINE PREG, ED   I personally reviewed and interpreted all labs.  No results found. I personally viewed above image(s) which were used in my medical decision making. Formal interpretations by Radiology.  EKG: normal EKG, normal sinus rhythm, no acute ischemia, unchanged from previous tracings.  MDM: Vernal Rutan is a 39 y.o. female with H&P as above who p/w CC: Ingestion  On arrival, patient is somnolent but arousable. She is hemodynamically stable and her oxygen saturation is 100% on nonrebreather. Patient does have a NPA in place. Patient is speaking some but then subsequently falls back asleep. Initial workup included screening labs, EKG, 2 mg Narcan.  Following Narcan no change in patient's mental status. Discussed case with poison control and they feel that there is a very low likelihood of patient having some chest depression from raid as she was not drinking directly from the canister. They do report the patient could have hydrocarbon effects such as pulmonary edema and bronchospasms however. Post control was more concern for possible other coingestants. Patient does appear to be intoxicated from alcohol as well. Skin because of her CNS depression. Patient is currently protecting her airway and ABG was done which confirms no signs of CO2 retention.  Screening workup was notable for normal salicylate and Tylenol levels and positive EtOH level. UDS is positive for benzos and marijuana.  While in the patient is now awake and alert and oriented 3. HDS, AAOx4. PERRL, EOMI, TML, face sym. CN 2-12 grossly intact. 5/5 sym, no drift, SILT, normal gait and coordination.  She reports she was trying to harm herself but does not want to open up about this. She denies homicidal ideation. Denies hallucinations. She is without complaints and says she feels normal now. Patient subsequently denies any urinary symptoms and UA  findings are not consistent with UTI this time. Patient will be placed in psych hold and we'll get psych evaluation. She is now medically clear.  Old records reviewed (if available). Labs and imaging reviewed personally by myself and considered in medical decision making if ordered.  Clinical Impression: 1. Ingestion of nontoxic substance, initial encounter   2. Suicide attempt   3. Alcohol intoxication, with delirium   4. Polysubstance overdose, undetermined intent, initial encounter     Disposition: undetermined at this time (awaiting psych eval)  Condition: Good  Pt seen in conjunction with Dr. Doug Sou, MD  Ames Dura, DO [facility] Emergency Medicine Resident - PGY-3      Ames Dura, MD 10/04/14 [id number]  Doug Sou, MD 10/04/14 [id number]

## 2014-10-04 ENCOUNTER — Encounter (HOSPITAL_COMMUNITY): Payer: Self-pay | Admitting: *Deleted

## 2014-10-04 ENCOUNTER — Inpatient Hospital Stay (HOSPITAL_COMMUNITY)
Admission: AD | Admit: 2014-10-04 | Discharge: 2014-10-05 | DRG: 882 | Disposition: A | Discharge status: Home / Self Care | Payer: Federal, State, Local not specified - Other | Source: Intra-hospital | Attending: Psychiatry | Admitting: Psychiatry

## 2014-10-04 DIAGNOSIS — F4323 Adjustment disorder with mixed anxiety and depressed mood: Secondary | ICD-10-CM | POA: Diagnosis present

## 2014-10-04 DIAGNOSIS — T1491 Suicide attempt: Secondary | ICD-10-CM | POA: Diagnosis not present

## 2014-10-04 DIAGNOSIS — T65892A Toxic effect of other specified substances, intentional self-harm, initial encounter: Secondary | ICD-10-CM

## 2014-10-04 DIAGNOSIS — T1491XA Suicide attempt, initial encounter: Secondary | ICD-10-CM | POA: Diagnosis present

## 2014-10-04 MED ORDER — SODIUM CHLORIDE 0.9 % IV SOLN
1000.0000 mL | INTRAVENOUS | Status: DC
  Administered 2014-10-04: 1000 mL via INTRAVENOUS

## 2014-10-04 MED ORDER — AZITHROMYCIN 250 MG PO TABS
250.0000 mg | ORAL_TABLET | Freq: Two times a day (BID) | ORAL | Status: DC
  Administered 2014-10-04 – 2014-10-05 (×2): 250 mg via ORAL
  Filled 2014-10-04 (×6): qty 1

## 2014-10-04 MED ORDER — TRAZODONE HCL 50 MG PO TABS
50.0000 mg | ORAL_TABLET | Freq: Every evening | ORAL | Status: DC | PRN
  Filled 2014-10-04: qty 14

## 2014-10-04 MED ORDER — CIPROFLOXACIN HCL 500 MG PO TABS
500.0000 mg | ORAL_TABLET | Freq: Two times a day (BID) | ORAL | Status: DC
  Filled 2014-10-04 (×2): qty 1

## 2014-10-04 MED ORDER — SODIUM CHLORIDE 0.9 % IV SOLN
1000.0000 mL | Freq: Once | INTRAVENOUS | Status: AC
  Administered 2014-10-04: 1000 mL via INTRAVENOUS

## 2014-10-04 MED ORDER — POTASSIUM CHLORIDE CRYS ER 20 MEQ PO TBCR
20.0000 meq | EXTENDED_RELEASE_TABLET | Freq: Two times a day (BID) | ORAL | Status: AC
  Administered 2014-10-04 – 2014-10-05 (×2): 20 meq via ORAL
  Filled 2014-10-04 (×4): qty 1

## 2014-10-04 MED ORDER — ACETAMINOPHEN 325 MG PO TABS
650.0000 mg | ORAL_TABLET | Freq: Four times a day (QID) | ORAL | Status: DC | PRN
  Administered 2014-10-04: 650 mg via ORAL
  Filled 2014-10-04: qty 2

## 2014-10-04 MED ORDER — MAGNESIUM HYDROXIDE 400 MG/5ML PO SUSP: 30.0000 mL | Freq: Every day | ORAL | Status: DC | PRN

## 2014-10-04 MED ORDER — ALUM & MAG HYDROXIDE-SIMETH 200-200-20 MG/5ML PO SUSP: 30.0000 mL | ORAL | Status: DC | PRN

## 2014-10-04 NOTE — Progress Notes (Signed)
Recreation Therapy Notes  Animal-Assisted Activity (AAA) Program Checklist/Progress Notes Patient Eligibility Criteria Checklist & Daily Group note for Rec Tx Intervention  Date: 08.09.16 Time: 2:45 pm Location: [address]  AAA/T Program Assumption of Risk Form signed by Patient/ or Parent Legal Guardian yes  Patient is free of allergies or sever asthma yes  Patient reports no fear of animals yes  Patient reports no history of cruelty to animalsyes  Patient understands his/her participation is voluntary yes  Patient washes hands before animal contact yes  Patient washes hands after animal contact yes  Education: Hand Washing, Appropriate Animal Interaction   Education Outcome: Acknowledges understanding/In group clarification offered/Needs additional education.   Clinical Observations/Feedback: Patient did not attend group.   Caroll Rancher, LRT/CTRS         Caroll Rancher A 10/04/2014 4:33 PM

## 2014-10-04 NOTE — Progress Notes (Signed)
Pt did not attend evening AA speaker meeting. Pt was resting in bed.

## 2014-10-04 NOTE — H&P (Signed)
Psychiatric Admission Assessment Adult  Patient Identification: Tiffany Monroe MRN:  [id number] Date of Evaluation:  10/04/2014 Chief Complaint:  "My husband made me so upset I drank some poison."  Principal Diagnosis: Adjustment disorder with mixed anxiety and depressed mood Diagnosis:   Patient Active Problem List   Diagnosis Date Noted  . Suicide attempt [T14.91] 10/04/2014  . Adjustment disorder with mixed anxiety and depressed mood [F43.23] 10/04/2014  . HYPONATREMIA [E87.1] 09/15/2009  . PYELONEPHRITIS [N12] 08/30/2009  . LIVER FUNCTION TESTS, ABNORMAL, HX OF [Z86.39] 08/30/2009   History of Present Illness::   Tiffany Monroe is an 39 y.o.married female who was brought to the [facility] by EMS tonight after ingesting RAID bug killer and drinking one bottle of wine. Patient reported ingesting these substances as a suicide attempt. Her stressor is fidelity problems in her marriage. During assessment at [facility] she reported history of many suicide attempts. Also had talked about seeing a girl everyday who tells her to harm self. She endorsed multiple depressive symptoms such as sadness, fatigue, guilt, tearfulness, isolating, lack of motivation, and poor sleep. The patient was cooperative with her assessment today. However, she denies any past suicide attempts and appears to minimize her symptoms. Patient is hoping to be discharged tomorrow. The patient stated "I got angry with my husband. I came back from [place] because I received some medicine for my kidneys. I had gotten an infection after my husband gave me chlamydia. He has been texting many women and I have proof. I also saw the medicine bottles that indicate he might have another STD. I am just ready to go. I was drinking wine because I was upset. He kept saying mean things to me. I put some Raid in my hand and drank it. The bottle was mostly empty. Then my husband called 68. I am just done with him. I had plans to go to [place] and live with family. I was even looking for jobs up there. I did not intend to come here. I was not really trying to kill myself. He was just causing me so much pain. I have never been in a mental hospital or seen a Psychiatrist. I generally function pretty well but sometimes get depressed. Generally I am happy and enjoy doing things for people. I do not deserve this kind of treatment. I do not want to kill myself. I have never been on any psychiatric medications. I did talk to a Doristine Bosworth when I was in [place] because I was upset about my husband. I really need to get on my way. I am not feeling sick at all from the stuff I consumed.' Denies any regular pattern of alcohol use and does not appear to be in withdrawal. Her vitals signs are found to be stable. She does not appear to be responding to internal stimuli. She does describe an episode where "I was praying about what to do. I could feel my mother-in-law's presence and felt her tuck me in." She denies any intent to harm her husband. Also reports not feeling in any danger from husband due to her intent to leave the relationship.   Elements:  Location:  depression, suicide attempt . Quality:  emotional distress over marital conflict . Severity:  Severe. Timing:  Last few months. Duration:  several years . Context:  Patient upset over husband's infedelity . Associated Signs/Symptoms: Depression Symptoms:  depressed mood, insomnia, psychomotor retardation, difficulty concentrating, hopelessness, recurrent thoughts of death, suicidal attempt, anxiety, loss of energy/fatigue, (  Hypo) Manic Symptoms:  Denies Anxiety Symptoms:  Excessive Worry, Psychotic Symptoms:  Denies but prior to admission was reported to be seeing a little girl  PTSD Symptoms: Negative Total Time spent with patient: 1 hour  Past Medical History:  Past Medical History  Diagnosis Date  . UTI (urinary tract infection)   . H/O: suicide attempt 01/2014    attempted  overdose on Tylenol   History reviewed. No pertinent past surgical history. Family History: History reviewed. No pertinent family history. Social History:  History  Alcohol Use  . Yes     History  Drug Use Not on file    History   Social History  . Marital Status: Married    Spouse Name: N/A  . Number of Children: N/A  . Years of Education: N/A   Social History Main Topics  . Smoking status: Never Smoker   . Smokeless tobacco: Not on file  . Alcohol Use: Yes  . Drug Use: Not on file  . Sexual Activity: Not on file   Other Topics Concern  . None   Social History Narrative   Additional Social History:                          Musculoskeletal: Strength & Muscle Tone: within normal limits Gait & Station: normal Patient leans: N/A  Psychiatric Specialty Exam: Physical Exam  Constitutional:  Physical exam findings reviewed from the [facility] and I concur with no exceptions noted.   Psychiatric: Her speech is normal and behavior is normal. Cognition and memory are normal. She expresses impulsivity. She exhibits a depressed mood. She expresses suicidal ideation.    Review of Systems  Constitutional: Negative.   HENT: Negative.   Eyes: Negative.   Respiratory: Negative.   Cardiovascular: Negative.   Gastrointestinal: Negative.  Negative for heartburn, nausea, vomiting, abdominal pain, diarrhea, constipation, blood in stool and melena.  Genitourinary: Positive for dysuria.  Musculoskeletal: Negative.   Skin: Negative.   Neurological: Negative.   Endo/Heme/Allergies: Negative.   Psychiatric/Behavioral: Positive for depression and suicidal ideas. Negative for hallucinations, memory loss and substance abuse. The patient is nervous/anxious and has insomnia.     Blood pressure 134/97, pulse 80, temperature 98.7 F (37.1 C), temperature source Oral, resp. rate 16, height 5' (1.524 m), weight 52.164 kg (115 lb), last menstrual period 10/03/2014, SpO2 99 %.Body mass  index is 22.46 kg/(m^2).  General Appearance: Disheveled  Eye Sport and exercise psychologist::  Fair  Speech:  Clear and Coherent and Slow  Volume:  Decreased  Mood:  Dysphoric  Affect:  Constricted  Thought Process:  Goal Directed and Intact  Orientation:  Full (Time, Place, and Person)  Thought Content:  Rumination  Suicidal Thoughts:  No  Homicidal Thoughts:  No  Memory:  Immediate;   Good Recent;   Good Remote;   Good  Judgement:  Impaired  Insight:  Present  Psychomotor Activity:  Decreased  Concentration:  Fair  Recall:  Good  Fund of Knowledge:Fair  Language: Fair  Akathisia:  No  Handed:  Right  AIMS (if indicated):     Assets:  Communication Skills Desire for Improvement Housing Leisure Time Physical Health Resilience Social Support  ADL's:  Intact  Cognition: WNL  Sleep:      Risk to Self: Is patient at risk for suicide?: Yes Risk to Others:   Prior Inpatient Therapy:   Prior Outpatient Therapy:    Alcohol Screening: Patient refused Alcohol Screening Tool: Yes 1. How  often do you have a drink containing alcohol?: Never 9. Have you or someone Tiffany been injured as a result of your drinking?: No 10. Has a relative or friend or a doctor or another health worker been concerned about your drinking or suggested you cut down?: No Alcohol Use Disorder Identification Test Final Score (AUDIT): 0 Brief Intervention: AUDIT score less than 7 or less-screening does not suggest unhealthy drinking-brief intervention not indicated  Allergies:   Allergies  Allergen Reactions  . Peanut-Containing Drug Products Itching and Swelling    Itching in throat and swelling lips   Lab Results:  Results for orders placed or performed during the hospital encounter of 10/03/14 (from the past 48 hour(s))  CBC with Differential     Status: Abnormal   Collection Time: 10/03/14  8:25 PM  Result Value Ref Range   WBC 3.9 (L) 4.0 - 10.5 K/uL   RBC 4.75 3.87 - 5.11 MIL/uL   Hemoglobin 14.0 12.0 - 15.0 g/dL    HCT 40.9 36.0 - 46.0 %   MCV 86.1 78.0 - 100.0 fL   MCH 29.5 26.0 - 34.0 pg   MCHC 34.2 30.0 - 36.0 g/dL   RDW 15.1 11.5 - 15.5 %   Platelets 260 150 - 400 K/uL   Neutrophils Relative % 35 (L) 43 - 77 %   Neutro Abs 1.4 (L) 1.7 - 7.7 K/uL   Lymphocytes Relative 55 (H) 12 - 46 %   Lymphs Abs 2.2 0.7 - 4.0 K/uL   Monocytes Relative 9 3 - 12 %   Monocytes Absolute 0.3 0.1 - 1.0 K/uL   Eosinophils Relative 2 0 - 5 %   Eosinophils Absolute 0.1 0.0 - 0.7 K/uL   Basophils Relative 0 0 - 1 %   Basophils Absolute 0.0 0.0 - 0.1 K/uL  Comprehensive metabolic panel     Status: Abnormal   Collection Time: 10/03/14  8:25 PM  Result Value Ref Range   Sodium 142 135 - 145 mmol/L   Potassium 3.4 (L) 3.5 - 5.1 mmol/L   Chloride 107 101 - 111 mmol/L   CO2 26 22 - 32 mmol/L   Glucose, Bld 98 65 - 99 mg/dL   BUN <5 (L) 6 - 20 mg/dL   Creatinine, Ser 0.81 0.44 - 1.00 mg/dL   Calcium 8.8 (L) 8.9 - 10.3 mg/dL   Total Protein 6.9 6.5 - 8.1 g/dL   Albumin 3.6 3.5 - 5.0 g/dL   AST 29 15 - 41 U/L   ALT 35 14 - 54 U/L   Alkaline Phosphatase 89 38 - 126 U/L   Total Bilirubin 0.5 0.3 - 1.2 mg/dL   GFR calc non Af Amer >60 >60 mL/min   GFR calc Af Amer >60 >60 mL/min    Comment: (NOTE) The eGFR has been calculated using the CKD EPI equation. This calculation has not been validated in all clinical situations. eGFR's persistently <60 mL/min signify possible Chronic Kidney Disease.    Anion gap 9 5 - 15  Ethanol     Status: Abnormal   Collection Time: 10/03/14  8:25 PM  Result Value Ref Range   Alcohol, Ethyl (B) 180 (H) <5 mg/dL    Comment:        LOWEST DETECTABLE LIMIT FOR SERUM ALCOHOL IS 5 mg/dL FOR MEDICAL PURPOSES ONLY   Salicylate level     Status: None   Collection Time: 10/03/14  8:25 PM  Result Value Ref Range   Salicylate Lvl <1.7 2.8 -  30.0 mg/dL  Acetaminophen level     Status: Abnormal   Collection Time: 10/03/14  8:25 PM  Result Value Ref Range   Acetaminophen (Tylenol),  Serum <10 (L) 10 - 30 ug/mL    Comment:        THERAPEUTIC CONCENTRATIONS VARY SIGNIFICANTLY. A RANGE OF 10-30 ug/mL MAY BE AN EFFECTIVE CONCENTRATION FOR MANY PATIENTS. HOWEVER, SOME ARE BEST TREATED AT CONCENTRATIONS OUTSIDE THIS RANGE. ACETAMINOPHEN CONCENTRATIONS >150 ug/mL AT 4 HOURS AFTER INGESTION AND >50 ug/mL AT 12 HOURS AFTER INGESTION ARE OFTEN ASSOCIATED WITH TOXIC REACTIONS.   CBG monitoring, ED     Status: None   Collection Time: 10/03/14  8:40 PM  Result Value Ref Range   Glucose-Capillary 73 65 - 99 mg/dL  I-Stat arterial blood gas, ED     Status: Abnormal   Collection Time: 10/03/14  9:02 PM  Result Value Ref Range   pH, Arterial 7.350 7.350 - 7.450   pCO2 arterial 44.0 35.0 - 45.0 mmHg   pO2, Arterial 295.0 (H) 80.0 - 100.0 mmHg   Bicarbonate 24.4 (H) 20.0 - 24.0 mEq/L   TCO2 26 0 - 100 mmol/L   O2 Saturation 100.0 %   Acid-base deficit 2.0 0.0 - 2.0 mmol/L   Patient temperature 98.1 F    Collection site RADIAL, ALLEN'S TEST ACCEPTABLE    Drawn by Operator    Sample type ARTERIAL   POC urine preg, ED     Status: None   Collection Time: 10/03/14 10:51 PM  Result Value Ref Range   Preg Test, Ur NEGATIVE NEGATIVE    Comment:        THE SENSITIVITY OF THIS METHODOLOGY IS >24 mIU/mL   Urinalysis, Routine w reflex microscopic     Status: Abnormal   Collection Time: 10/03/14 11:00 PM  Result Value Ref Range   Color, Urine YELLOW YELLOW   APPearance CLEAR CLEAR   Specific Gravity, Urine 1.010 1.005 - 1.030   pH 5.5 5.0 - 8.0   Glucose, UA NEGATIVE NEGATIVE mg/dL   Hgb urine dipstick NEGATIVE NEGATIVE   Bilirubin Urine NEGATIVE NEGATIVE   Ketones, ur NEGATIVE NEGATIVE mg/dL   Protein, ur NEGATIVE NEGATIVE mg/dL   Urobilinogen, UA 0.2 0.0 - 1.0 mg/dL   Nitrite POSITIVE (A) NEGATIVE   Leukocytes, UA TRACE (A) NEGATIVE  Urine rapid drug screen (hosp performed)     Status: Abnormal   Collection Time: 10/03/14 11:00 PM  Result Value Ref Range   Opiates  NONE DETECTED NONE DETECTED   Cocaine NONE DETECTED NONE DETECTED   Benzodiazepines POSITIVE (A) NONE DETECTED   Amphetamines NONE DETECTED NONE DETECTED   Tetrahydrocannabinol POSITIVE (A) NONE DETECTED   Barbiturates NONE DETECTED NONE DETECTED    Comment:        DRUG SCREEN FOR MEDICAL PURPOSES ONLY.  IF CONFIRMATION IS NEEDED FOR ANY PURPOSE, NOTIFY LAB WITHIN 5 DAYS.        LOWEST DETECTABLE LIMITS FOR URINE DRUG SCREEN Drug Class       Cutoff (ng/mL) Amphetamine      1000 Barbiturate      200 Benzodiazepine   088 Tricyclics       110 Opiates          300 Cocaine          300 THC              50   Urine microscopic-add on     Status: Abnormal   Collection Time: 10/03/14 11:00  PM  Result Value Ref Range   Squamous Epithelial / LPF RARE RARE   WBC, UA 3-6 <3 WBC/hpf   Bacteria, UA FEW (A) RARE   Current Medications: Current Facility-Administered Medications  Medication Dose Route Frequency Provider Last Rate Last Dose  . acetaminophen (TYLENOL) tablet 650 mg  650 mg Oral Q6H PRN Niel Hummer, NP      . alum & mag hydroxide-simeth (MAALOX/MYLANTA) 200-200-20 MG/5ML suspension 30 mL  30 mL Oral Q4H PRN Niel Hummer, NP      . azithromycin [facility]) tablet 250 mg  250 mg Oral BID Niel Hummer, NP      . magnesium hydroxide (MILK OF MAGNESIA) suspension 30 mL  30 mL Oral Daily PRN Niel Hummer, NP      . traZODone (DESYREL) tablet 50 mg  50 mg Oral QHS PRN Niel Hummer, NP       PTA Medications: Prescriptions prior to admission  Medication Sig Dispense Refill Last Dose  . Multiple Vitamins-Minerals (MULTIVITAMINS THER. W/MINERALS) TABS Take 1 tablet by mouth daily.    Past Week at Unknown time    Previous Psychotropic Medications: No   Substance Abuse History in the last 12 months:  No.  Consequences of Substance Abuse: Negative  Results for orders placed or performed during the hospital encounter of 10/03/14 (from the past 72 hour(s))  CBC with Differential      Status: Abnormal   Collection Time: 10/03/14  8:25 PM  Result Value Ref Range   WBC 3.9 (L) 4.0 - 10.5 K/uL   RBC 4.75 3.87 - 5.11 MIL/uL   Hemoglobin 14.0 12.0 - 15.0 g/dL   HCT 40.9 36.0 - 46.0 %   MCV 86.1 78.0 - 100.0 fL   MCH 29.5 26.0 - 34.0 pg   MCHC 34.2 30.0 - 36.0 g/dL   RDW 15.1 11.5 - 15.5 %   Platelets 260 150 - 400 K/uL   Neutrophils Relative % 35 (L) 43 - 77 %   Neutro Abs 1.4 (L) 1.7 - 7.7 K/uL   Lymphocytes Relative 55 (H) 12 - 46 %   Lymphs Abs 2.2 0.7 - 4.0 K/uL   Monocytes Relative 9 3 - 12 %   Monocytes Absolute 0.3 0.1 - 1.0 K/uL   Eosinophils Relative 2 0 - 5 %   Eosinophils Absolute 0.1 0.0 - 0.7 K/uL   Basophils Relative 0 0 - 1 %   Basophils Absolute 0.0 0.0 - 0.1 K/uL  Comprehensive metabolic panel     Status: Abnormal   Collection Time: 10/03/14  8:25 PM  Result Value Ref Range   Sodium 142 135 - 145 mmol/L   Potassium 3.4 (L) 3.5 - 5.1 mmol/L   Chloride 107 101 - 111 mmol/L   CO2 26 22 - 32 mmol/L   Glucose, Bld 98 65 - 99 mg/dL   BUN <5 (L) 6 - 20 mg/dL   Creatinine, Ser 0.81 0.44 - 1.00 mg/dL   Calcium 8.8 (L) 8.9 - 10.3 mg/dL   Total Protein 6.9 6.5 - 8.1 g/dL   Albumin 3.6 3.5 - 5.0 g/dL   AST 29 15 - 41 U/L   ALT 35 14 - 54 U/L   Alkaline Phosphatase 89 38 - 126 U/L   Total Bilirubin 0.5 0.3 - 1.2 mg/dL   GFR calc non Af Amer >60 >60 mL/min   GFR calc Af Amer >60 >60 mL/min    Comment: (NOTE) The eGFR has been calculated  using the CKD EPI equation. This calculation has not been validated in all clinical situations. eGFR's persistently <60 mL/min signify possible Chronic Kidney Disease.    Anion gap 9 5 - 15  Ethanol     Status: Abnormal   Collection Time: 10/03/14  8:25 PM  Result Value Ref Range   Alcohol, Ethyl (B) 180 (H) <5 mg/dL    Comment:        LOWEST DETECTABLE LIMIT FOR SERUM ALCOHOL IS 5 mg/dL FOR MEDICAL PURPOSES ONLY   Salicylate level     Status: None   Collection Time: 10/03/14  8:25 PM  Result Value Ref  Range   Salicylate Lvl <5.1 2.8 - 30.0 mg/dL  Acetaminophen level     Status: Abnormal   Collection Time: 10/03/14  8:25 PM  Result Value Ref Range   Acetaminophen (Tylenol), Serum <10 (L) 10 - 30 ug/mL    Comment:        THERAPEUTIC CONCENTRATIONS VARY SIGNIFICANTLY. A RANGE OF 10-30 ug/mL MAY BE AN EFFECTIVE CONCENTRATION FOR MANY PATIENTS. HOWEVER, SOME ARE BEST TREATED AT CONCENTRATIONS OUTSIDE THIS RANGE. ACETAMINOPHEN CONCENTRATIONS >150 ug/mL AT 4 HOURS AFTER INGESTION AND >50 ug/mL AT 12 HOURS AFTER INGESTION ARE OFTEN ASSOCIATED WITH TOXIC REACTIONS.   CBG monitoring, ED     Status: None   Collection Time: 10/03/14  8:40 PM  Result Value Ref Range   Glucose-Capillary 73 65 - 99 mg/dL  I-Stat arterial blood gas, ED     Status: Abnormal   Collection Time: 10/03/14  9:02 PM  Result Value Ref Range   pH, Arterial 7.350 7.350 - 7.450   pCO2 arterial 44.0 35.0 - 45.0 mmHg   pO2, Arterial 295.0 (H) 80.0 - 100.0 mmHg   Bicarbonate 24.4 (H) 20.0 - 24.0 mEq/L   TCO2 26 0 - 100 mmol/L   O2 Saturation 100.0 %   Acid-base deficit 2.0 0.0 - 2.0 mmol/L   Patient temperature 98.1 F    Collection site RADIAL, ALLEN'S TEST ACCEPTABLE    Drawn by Operator    Sample type ARTERIAL   POC urine preg, ED     Status: None   Collection Time: 10/03/14 10:51 PM  Result Value Ref Range   Preg Test, Ur NEGATIVE NEGATIVE    Comment:        THE SENSITIVITY OF THIS METHODOLOGY IS >24 mIU/mL   Urinalysis, Routine w reflex microscopic     Status: Abnormal   Collection Time: 10/03/14 11:00 PM  Result Value Ref Range   Color, Urine YELLOW YELLOW   APPearance CLEAR CLEAR   Specific Gravity, Urine 1.010 1.005 - 1.030   pH 5.5 5.0 - 8.0   Glucose, UA NEGATIVE NEGATIVE mg/dL   Hgb urine dipstick NEGATIVE NEGATIVE   Bilirubin Urine NEGATIVE NEGATIVE   Ketones, ur NEGATIVE NEGATIVE mg/dL   Protein, ur NEGATIVE NEGATIVE mg/dL   Urobilinogen, UA 0.2 0.0 - 1.0 mg/dL   Nitrite POSITIVE (A)  NEGATIVE   Leukocytes, UA TRACE (A) NEGATIVE  Urine rapid drug screen (hosp performed)     Status: Abnormal   Collection Time: 10/03/14 11:00 PM  Result Value Ref Range   Opiates NONE DETECTED NONE DETECTED   Cocaine NONE DETECTED NONE DETECTED   Benzodiazepines POSITIVE (A) NONE DETECTED   Amphetamines NONE DETECTED NONE DETECTED   Tetrahydrocannabinol POSITIVE (A) NONE DETECTED   Barbiturates NONE DETECTED NONE DETECTED    Comment:        DRUG SCREEN FOR MEDICAL PURPOSES ONLY.  IF CONFIRMATION IS NEEDED FOR ANY PURPOSE, NOTIFY LAB WITHIN 5 DAYS.        LOWEST DETECTABLE LIMITS FOR URINE DRUG SCREEN Drug Class       Cutoff (ng/mL) Amphetamine      1000 Barbiturate      200 Benzodiazepine   144 Tricyclics       360 Opiates          300 Cocaine          300 THC              50   Urine microscopic-add on     Status: Abnormal   Collection Time: 10/03/14 11:00 PM  Result Value Ref Range   Squamous Epithelial / LPF RARE RARE   WBC, UA 3-6 <3 WBC/hpf   Bacteria, UA FEW (A) RARE    Observation Level/Precautions:  15 minute checks  Laboratory:  CBC Chemistry Profile UDS  Psychotherapy:  Individual and Group   Medications:  Monitor need for psychotropic medications, Start Azithromycin 250 mg times four doses for UTI, patient requests this antibiotic due to intolerance to other ones in the past such as Cipro  Consultations:  As needed  Discharge Concerns:  Safety and Stability   Estimated LOS: 2-5 days   Other:  Obtain collateral information    Psychological Evaluations: Yes   Treatment Plan Summary: Daily contact with patient to assess and evaluate symptoms and progress in treatment and Medication management  Medical Decision Making:  New problem, with additional work up planned, Review of Psycho-Social Stressors (1), Review or order clinical lab tests (1), Review of Last Therapy Session (1), Review of Medication Regimen & Side Effects (2) and Review of New Medication or  Change in Dosage (2)  I certify that inpatient services furnished can reasonably be expected to improve the patient's condition.   Elmarie Shiley NP-C 8/9/20165:15 PM I personally assessed the patient, reviewed the physical exam and labs and formulated the treatment plan Geralyn Flash A. Sabra Heck, M.D.

## 2014-10-04 NOTE — Discharge Instructions (Signed)
Transfer to [facility] 

## 2014-10-04 NOTE — Progress Notes (Signed)
Per Julieanne Cotton, pt accepted to [facility] bed 301-2 by Dr. Dub Mikes. Bed will be ready at 11am today. Admission is voluntary.   Spoke with [facility] RN re: pt's placement  Ilean Skill, MSW, LCSW Clinical Social Work, Disposition  10/04/2014 [phone]

## 2014-10-04 NOTE — Progress Notes (Signed)
Patient in bed resting at the beginning of the shift. Her mood and affects flat and depressed. She reported that she is feeling better. Denied SI/HI and denied Hallucinations. The only complaints  She had was chills. Writer encouraged and supported patient. Q 15 minute check continues as ordered to maintain safety.

## 2014-10-04 NOTE — ED Notes (Signed)
Pt signed consent forms - copy faxed to [facility].

## 2014-10-04 NOTE — ED Notes (Signed)
Security wanded pt after she changed into paper scrubs from gown.

## 2014-10-04 NOTE — ED Notes (Signed)
Per Meagan, [facility], pt accepted to [facility] - 301-2 - Dr Dub Mikes - may arrive after 1100.
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
## 2014-10-04 NOTE — [facility] Suicide Risk Assessment (Signed)
[facility] Admission Suicide Risk Assessment   Nursing information obtained from:  Patient Demographic factors:  Low socioeconomic status Current Mental Status:  Self-harm behaviors Loss Factors:  Loss of significant relationship, Financial problems / change in socioeconomic status Historical Factors:  Prior suicide attempts Risk Reduction Factors:  Religious beliefs about death Total Time spent with patient: 45 minutes Principal Problem: MDD (major depressive disorder), recurrent, severe, with psychosis Diagnosis:   Patient Active Problem List   Diagnosis Date Noted  . Suicide attempt [T14.91] 10/04/2014  . MDD (major depressive disorder), recurrent, severe, with psychosis [F33.3] 10/04/2014  . HYPONATREMIA [E87.1] 09/15/2009  . PYELONEPHRITIS [N12] 08/30/2009  . LIVER FUNCTION TESTS, ABNORMAL, HX OF [Z86.39] 08/30/2009     Continued Clinical Symptoms:  Alcohol Use Disorder Identification Test Final Score (AUDIT): 0 The "Alcohol Use Disorders Identification Test", Guidelines for Use in Primary Care, Second Edition.  World Science writer [facility]). Score between 0-7:  no or low risk or alcohol related problems. Score between 8-15:  moderate risk of alcohol related problems. Score between 16-19:  high risk of alcohol related problems. Score 20 or above:  warrants further diagnostic evaluation for alcohol dependence and treatment.   CLINICAL FACTORS:   Depression:   Impulsivity   Musculoskeletal: Strength & Muscle Tone: within normal limits Gait & Station: normal Patient leans: normal  Psychiatric Specialty Exam: Physical Exam  Review of Systems  Constitutional: Negative.   HENT:       Throbbing   Eyes: Negative.   Respiratory: Negative.   Cardiovascular: Negative.   Gastrointestinal: Negative.   Genitourinary: Positive for urgency.  Musculoskeletal: Negative.   Skin: Negative.   Neurological: Positive for headaches.  Endo/Heme/Allergies: Negative.    Psychiatric/Behavioral: The patient is nervous/anxious and has insomnia.     Blood pressure 134/97, pulse 80, temperature 98.7 F (37.1 C), temperature source Oral, resp. rate 16, height 5' (1.524 m), weight 52.164 kg (115 lb), last menstrual period 10/03/2014, SpO2 99 %.Body mass index is 22.46 kg/(m^2).  General Appearance: Fairly Groomed  Patent attorney::  Fair  Speech:  Clear and Coherent  Volume:  Normal  Mood:  Anxious and worried as does not see the need for her to be here  Affect:  Appropriate  Thought Process:  Coherent and Goal Directed  Orientation:  Full (Time, Place, and Person)  Thought Content:  symptoms events worries concerns  Suicidal Thoughts:  No  Homicidal Thoughts:  No  Memory:  Immediate;   Fair Recent;   Fair Remote;   Fair  Judgement:  Fair  Insight:  Present  Psychomotor Activity:  Normal  Concentration:  Fair  Recall:  [employer] of Knowledge:Fair  Language: Fair  Akathisia:  No  Handed:  Right  AIMS (if indicated):     Assets:  Desire for Improvement Housing Social Support  Sleep:     Cognition: WNL  ADL's:  Intact     COGNITIVE FEATURES THAT CONTRIBUTE TO RISK:  None    SUICIDE RISK:   Mild:  Suicidal ideation of limited frequency, intensity, duration, and specificity.  There are no identifiable plans, no associated intent, mild dysphoria and related symptoms, good self-control (both objective and subjective assessment), few other risk factors, and identifiable protective factors, including available and accessible social support. 39 Y/o female who states that she drank a bottle of wine and put some ant spray on her hand and ingested it.  . States she had told her husband she was leaving to go to [place]. States  that they have been having conflict in the relationship. Says he likes to "have a lot of women." states she has the two daughters (40 and 57) from first relationship with their father in [place]. States that they have been together for 14  years. States he changed in the last year. States that she suggested couples counseling but he refused. She says she is tired of trying and that she is living. She has a plan in place to go to [place]. She has family there and some work opportunities in [place]. States she is not used to drinking and she drank the whole bottle. She states that what she did with the ant spray was impulsive and out of character for her. She state she would never try to hurt herself. In the past when she has felt suicidal has been associated to the relationship and she is tired of trying to make it work and feels he is not going to change. She wants to be D/C home so she can go to [place] as planned PLAN OF CARE: Supportive approach/coping skills                               Conflict in the relationship with her husband; will work on exploring other                                     ways she can approach the conflict                               Will explore further the presence of other psychopathology. Note; she states she feels the presence of her husband's dead mother, but states this is a positive thing as they were very close and his mother was very protective of her She also understands that alcohol can affect her rational thinking and make her feel more impulsive                               Will work with CBT/mindfulness Medical Decision Making:  Review of Psycho-Social Stressors (1), Review or order clinical lab tests (1), Review of Medication Regimen & Side Effects (2) and Review of New Medication or Change in Dosage (2)  I certify that inpatient services furnished can reasonably be expected to improve the patient's condition.   Larosa Rhines A 10/04/2014, 4:21 PM

## 2014-10-04 NOTE — ED Notes (Signed)
[place] advised will call back if will be longer than 45 minutes for transportation to arrive.

## 2014-10-04 NOTE — ED Notes (Signed)
Placed in wine scrubs at this time.  Alert and oriented at this time.  States I feel better.

## 2014-10-04 NOTE — ED Provider Notes (Signed)
Patient has been observed in the emergency department and is hemodynamically stable. Blood pressure is in range that she has been in before. Discussion with poison control was held and she is no longer in any medical danger from her replaced in. She is medically cleared to proceed with psychiatric placement.  Dione Booze, MD 10/04/14 [phone]

## 2014-10-04 NOTE — ED Notes (Signed)
Spoke with poison control.  Feels patient is medically clear.  Dr. Preston Fleeting notified.

## 2014-10-04 NOTE — [facility] Group Notes (Signed)
[facility] LCSW Group Therapy  10/04/2014 12:50 PM  Type of Therapy:  Group Therapy  Participation Level:  Did Not Attend-resting in room.  Summary of Progress/Problems: MHA Speaker came to talk about his personal journey with substance abuse and addiction. The pt processed ways by which to relate to the speaker. MHA speaker provided handouts and educational information pertaining to groups and services offered by the [facility].   Smart, Tiffany Monroe LCSWA 10/04/2014, 12:50 PM

## 2014-10-04 NOTE — ED Notes (Signed)
Spoke with Dorinda Hill from poison control.  Recommended 1Liter NS bolus to be given.  If more awake in an hour feels patient will be ready to be medically cleared.

## 2014-10-04 NOTE — ED Notes (Signed)
Pt on phone at nurses' desk. 

## 2014-10-04 NOTE — ED Notes (Signed)
wanded by security at this time 

## 2014-10-04 NOTE — ED Provider Notes (Signed)
Patient resting comfortably, no distress.  Filed Vitals:   10/04/14 0630  BP: 91/59  Pulse: 76  Temp:   Resp: 17   Pt accepted to [facility], Dr Dub Mikes, bed ready.  Pt currently appears stable for transfer.     Cathren Laine, MD 10/04/14 [phone]

## 2014-10-04 NOTE — ED Provider Notes (Addendum)
Asked to reevaluate patient by behavioral health. She meets inpatient criteria for admission, but they were concerned about possible medical clearance. Poison control consultation has been performed. Post control did not feel that her altered sensorium is related to the Raid. They felt it was more secondary to alcohol. They do not recommend any further interventions secondary to the raid ingestion. From an ingestion standpoint, she can be medically cleared. She was, however, very slightly hypotensive. Blood pressure low 100s. Patient reports some mild dizziness with this. Patient will be given a liter of normal saline solution. Anticipate medical clearance after fluids and repeat examination.  Gilda Crease, MD 10/04/14 0030  Gilda Crease, MD 10/04/14 0040

## 2014-10-04 NOTE — Progress Notes (Signed)
Patient ID: Tiffany Monroe, female   DOB: 03/16/1975, 39 y.o.   MRN: [id number] Pt is a 39 year old Voluntary admit with a prior suicide attempt 8 months ago by OD on tylenol.Pt has a medical hx of left kidney problems . She has allergies to peanuts . Pt stated her husband of 10 years has been cheating on her for 10 years. She admits they had an argument and she drank Raid bug spray and alcohol. Pts urine was positive for benzos and THC as well. On occassions the pt does admit to hallucinations and states she sees a little girl that tells her to kill herself. Pt does contract for safety and denies SI and HI at this time. She was brought onto the unit and given lunch. Report given to [place]. Pt has two children ages 54 and 79 that reside with their bio dad in [place].

## 2014-10-05 MED ORDER — TRAZODONE HCL 50 MG PO TABS: 50.0000 mg | ORAL_TABLET | Freq: Every evening | ORAL | Status: AC | PRN

## 2014-10-05 MED ORDER — NITROFURANTOIN MONOHYD MACRO 100 MG PO CAPS
100.0000 mg | ORAL_CAPSULE | Freq: Two times a day (BID) | ORAL | Status: DC
  Administered 2014-10-05: 100 mg via ORAL
  Filled 2014-10-05: qty 1
  Filled 2014-10-05 (×2): qty 9
  Filled 2014-10-05 (×3): qty 1

## 2014-10-05 MED ORDER — NITROFURANTOIN MONOHYD MACRO 100 MG PO CAPS: 100.0000 mg | ORAL_CAPSULE | Freq: Two times a day (BID) | ORAL | Status: AC

## 2014-10-05 NOTE — Discharge Summary (Signed)
Physician Discharge Summary Note  Patient:  Tiffany Monroe is an 39 y.o., female MRN:  [id number] DOB:  1976-01-06 Patient phone:  [phone] (home)  Patient address:   [address] [place] [address],  Total Time spent with patient: 45 minutes  Date of Admission:  10/04/2014 Date of Discharge: 10/05/2014  Reason for Admission:   Tiffany Monroe is an 39 y.o.married female who was brought to the [facility] by EMS tonight after ingesting RAID bug killer and drinking one bottle of wine. Patient reported ingesting these substances as a suicide attempt. Her stressor is fidelity problems in her marriage. During assessment at [facility] she reported history of many suicide attempts. Also had talked about seeing a girl everyday who tells her to harm self. She endorsed multiple depressive symptoms such as sadness, fatigue, guilt, tearfulness, isolating, lack of motivation, and poor sleep. The patient was cooperative with her assessment today. However, she denies any past suicide attempts and appears to minimize her symptoms. Patient is hoping to be discharged tomorrow. The patient stated "I got angry with my husband. I came back from [place] because I received some medicine for my kidneys. I had gotten an infection after my husband gave me chlamydia. He has been texting many women and I have proof. I also saw the medicine bottles that indicate he might have another STD. I am just ready to go. I was drinking wine because I was upset. He kept saying mean things to me. I put some Raid in my hand and drank it. The bottle was mostly empty. Then my husband called 911. I am just done with him. I had plans to go to [place] and live with family. I was even looking for jobs up there. I did not intend to come here. I was not really trying to kill myself. He was just causing me so much pain. I have never been in a mental hospital or seen a Psychiatrist. I generally function pretty well but sometimes get  depressed. Generally I am happy and enjoy doing things for people. I do not deserve this kind of treatment. I do not want to kill myself. I have never been on any psychiatric medications. I did talk to a Renato Gails when I was in [place] because I was upset about my husband. I really need to get on my way. I am not feeling sick at all from the stuff I consumed.' Denies any regular pattern of alcohol use and does not appear to be in withdrawal. Her vitals signs are found to be stable. She does not appear to be responding to internal stimuli. She does describe an episode where "I was praying about what to do. I could feel my mother-in-law's presence and felt her tuck me in." She denies any intent to harm her husband. Also reports not feeling in any danger from husband due to her intent to leave the relationship.    Principal Problem: Adjustment disorder with mixed anxiety and depressed mood Discharge Diagnoses: Patient Active Problem List   Diagnosis Date Noted  . Suicide attempt [T14.91] 10/04/2014    Priority: High  . Adjustment disorder with mixed anxiety and depressed mood [F43.23] 10/04/2014    Priority: High  . HYPONATREMIA [E87.1] 09/15/2009  . PYELONEPHRITIS [N12] 08/30/2009  . LIVER FUNCTION TESTS, ABNORMAL, HX OF [Z86.39] 08/30/2009    Musculoskeletal: Strength & Muscle Tone: within normal limits Gait & Station: normal Patient leans: N/A  Psychiatric Specialty Exam: Physical Exam  Review of Systems  Psychiatric/Behavioral: Positive for depression and substance abuse. Negative for suicidal ideas and hallucinations. The patient is nervous/anxious and has insomnia.   All other systems reviewed and are negative.   Blood pressure 97/63, pulse 71, temperature 97.5 F (36.4 C), temperature source Oral, resp. rate 18, height 5' (1.524 m), weight 52.164 kg (115 lb), last menstrual period 10/03/2014, SpO2 99 %.Body mass index is 22.46 kg/(m^2).    SEE MD PSE within the SRA Have you used any  form of tobacco in the last 30 days? (Cigarettes, Smokeless Tobacco, Cigars, and/or Pipes): No  Has this patient used any form of tobacco in the last 30 days? (Cigarettes, Smokeless Tobacco, Cigars, and/or Pipes) No  Past Medical History:  Past Medical History  Diagnosis Date  . UTI (urinary tract infection)   . H/O: suicide attempt 01/2014    attempted overdose on Tylenol   History reviewed. No pertinent past surgical history. Family History: History reviewed. No pertinent family history. Social History:  History  Alcohol Use  . Yes     History  Drug Use Not on file    Social History   Social History  . Marital Status: Married    Spouse Name: N/A  . Number of Children: N/A  . Years of Education: N/A   Social History Main Topics  . Smoking status: Never Smoker   . Smokeless tobacco: None  . Alcohol Use: Yes  . Drug Use: None  . Sexual Activity: Not Asked   Other Topics Concern  . None   Social History Narrative     Risk to Self: Is patient at risk for suicide?: Yes Risk to Others:   Prior Inpatient Therapy:   Prior Outpatient Therapy:    Level of Care:  OP  Hospital Monroe:   Tiffany Monroe was admitted for Adjustment disorder with mixed anxiety and depressed mood, and crisis management.  Pt was treated discharged with the medications listed below under Medication List.  Medical problems were identified and treated as needed.  Home medications were restarted as appropriate.  Improvement was monitored by observation and Tiffany Monroe 's daily report of symptom reduction.  Emotional and mental status was monitored by daily self-inventory reports completed by Tiffany Monroe and clinical staff.         Tiffany Monroe was evaluated by the treatment team for stability and plans for continued recovery upon discharge. Tiffany Monroe 's motivation was an integral factor for scheduling further treatment. Employment,  transportation, bed availability, health status, family support, and any pending legal issues were also considered during hospital stay. Pt was offered further treatment options upon discharge including but not limited to Residential, Intensive Outpatient, and Outpatient treatment.  Tiffany Monroe will follow up with the services as listed below under Follow Up Information.     Upon completion of this admission the patient was both mentally and medically stable for discharge denying suicidal/homicidal ideation, auditory/visual/tactile hallucinations, delusional thoughts and paranoia.    Consults:  None  Significant Diagnostic Studies:  K+ 3.4 (L, asymptomatic), BAL 180, UDS + for Benzo and THC  Discharge Vitals:   Blood pressure 97/63, pulse 71, temperature 97.5 F (36.4 C), temperature source Oral, resp. rate 18, height 5' (1.524 m), weight 52.164 kg (115 lb), last menstrual period 10/03/2014, SpO2 99 %. Body mass index is 22.46 kg/(m^2). Lab Results:   Results for orders placed or performed during the hospital encounter of 10/03/14 (from the past 72 hour(s))  CBC with  Differential     Status: Abnormal   Collection Time: 10/03/14  8:25 PM  Result Value Ref Range   WBC 3.9 (L) 4.0 - 10.5 K/uL   RBC 4.75 3.87 - 5.11 MIL/uL   Hemoglobin 14.0 12.0 - 15.0 g/dL   HCT 40.9 36.0 - 46.0 %   MCV 86.1 78.0 - 100.0 fL   MCH 29.5 26.0 - 34.0 pg   MCHC 34.2 30.0 - 36.0 g/dL   RDW 15.1 11.5 - 15.5 %   Platelets 260 150 - 400 K/uL   Neutrophils Relative % 35 (L) 43 - 77 %   Neutro Abs 1.4 (L) 1.7 - 7.7 K/uL   Lymphocytes Relative 55 (H) 12 - 46 %   Lymphs Abs 2.2 0.7 - 4.0 K/uL   Monocytes Relative 9 3 - 12 %   Monocytes Absolute 0.3 0.1 - 1.0 K/uL   Eosinophils Relative 2 0 - 5 %   Eosinophils Absolute 0.1 0.0 - 0.7 K/uL   Basophils Relative 0 0 - 1 %   Basophils Absolute 0.0 0.0 - 0.1 K/uL  Comprehensive metabolic panel     Status: Abnormal   Collection Time: 10/03/14  8:25 PM   Result Value Ref Range   Sodium 142 135 - 145 mmol/L   Potassium 3.4 (L) 3.5 - 5.1 mmol/L   Chloride 107 101 - 111 mmol/L   CO2 26 22 - 32 mmol/L   Glucose, Bld 98 65 - 99 mg/dL   BUN <5 (L) 6 - 20 mg/dL   Creatinine, Ser 0.81 0.44 - 1.00 mg/dL   Calcium 8.8 (L) 8.9 - 10.3 mg/dL   Total Protein 6.9 6.5 - 8.1 g/dL   Albumin 3.6 3.5 - 5.0 g/dL   AST 29 15 - 41 U/L   ALT 35 14 - 54 U/L   Alkaline Phosphatase 89 38 - 126 U/L   Total Bilirubin 0.5 0.3 - 1.2 mg/dL   GFR calc non Af Amer >60 >60 mL/min   GFR calc Af Amer >60 >60 mL/min    Comment: (NOTE) The eGFR has been calculated using the CKD EPI equation. This calculation has not been validated in all clinical situations. eGFR's persistently <60 mL/min signify possible Chronic Kidney Disease.    Anion gap 9 5 - 15  Ethanol     Status: Abnormal   Collection Time: 10/03/14  8:25 PM  Result Value Ref Range   Alcohol, Ethyl (B) 180 (H) <5 mg/dL    Comment:        LOWEST DETECTABLE LIMIT FOR SERUM ALCOHOL IS 5 mg/dL FOR MEDICAL PURPOSES ONLY   Salicylate level     Status: None   Collection Time: 10/03/14  8:25 PM  Result Value Ref Range   Salicylate Lvl <9.8 2.8 - 30.0 mg/dL  Acetaminophen level     Status: Abnormal   Collection Time: 10/03/14  8:25 PM  Result Value Ref Range   Acetaminophen (Tylenol), Serum <10 (L) 10 - 30 ug/mL    Comment:        THERAPEUTIC CONCENTRATIONS VARY SIGNIFICANTLY. A RANGE OF 10-30 ug/mL MAY BE AN EFFECTIVE CONCENTRATION FOR MANY PATIENTS. HOWEVER, SOME ARE BEST TREATED AT CONCENTRATIONS OUTSIDE THIS RANGE. ACETAMINOPHEN CONCENTRATIONS >150 ug/mL AT 4 HOURS AFTER INGESTION AND >50 ug/mL AT 12 HOURS AFTER INGESTION ARE OFTEN ASSOCIATED WITH TOXIC REACTIONS.   CBG monitoring, ED     Status: None   Collection Time: 10/03/14  8:40 PM  Result Value Ref Range   Glucose-Capillary  73 65 - 99 mg/dL  I-Stat arterial blood gas, ED     Status: Abnormal   Collection Time: 10/03/14  9:02 PM   Result Value Ref Range   pH, Arterial 7.350 7.350 - 7.450   pCO2 arterial 44.0 35.0 - 45.0 mmHg   pO2, Arterial 295.0 (H) 80.0 - 100.0 mmHg   Bicarbonate 24.4 (H) 20.0 - 24.0 mEq/L   TCO2 26 0 - 100 mmol/L   O2 Saturation 100.0 %   Acid-base deficit 2.0 0.0 - 2.0 mmol/L   Patient temperature 98.1 F    Collection site RADIAL, ALLEN'S TEST ACCEPTABLE    Drawn by Operator    Sample type ARTERIAL   POC urine preg, ED     Status: None   Collection Time: 10/03/14 10:51 PM  Result Value Ref Range   Preg Test, Ur NEGATIVE NEGATIVE    Comment:        THE SENSITIVITY OF THIS METHODOLOGY IS >24 mIU/mL   Urinalysis, Routine w reflex microscopic     Status: Abnormal   Collection Time: 10/03/14 11:00 PM  Result Value Ref Range   Color, Urine YELLOW YELLOW   APPearance CLEAR CLEAR   Specific Gravity, Urine 1.010 1.005 - 1.030   pH 5.5 5.0 - 8.0   Glucose, UA NEGATIVE NEGATIVE mg/dL   Hgb urine dipstick NEGATIVE NEGATIVE   Bilirubin Urine NEGATIVE NEGATIVE   Ketones, ur NEGATIVE NEGATIVE mg/dL   Protein, ur NEGATIVE NEGATIVE mg/dL   Urobilinogen, UA 0.2 0.0 - 1.0 mg/dL   Nitrite POSITIVE (A) NEGATIVE   Leukocytes, UA TRACE (A) NEGATIVE  Urine rapid drug screen (hosp performed)     Status: Abnormal   Collection Time: 10/03/14 11:00 PM  Result Value Ref Range   Opiates NONE DETECTED NONE DETECTED   Cocaine NONE DETECTED NONE DETECTED   Benzodiazepines POSITIVE (A) NONE DETECTED   Amphetamines NONE DETECTED NONE DETECTED   Tetrahydrocannabinol POSITIVE (A) NONE DETECTED   Barbiturates NONE DETECTED NONE DETECTED    Comment:        DRUG SCREEN FOR MEDICAL PURPOSES ONLY.  IF CONFIRMATION IS NEEDED FOR ANY PURPOSE, NOTIFY LAB WITHIN 5 DAYS.        LOWEST DETECTABLE LIMITS FOR URINE DRUG SCREEN Drug Class       Cutoff (ng/mL) Amphetamine      1000 Barbiturate      200 Benzodiazepine   712 Tricyclics       458 Opiates          300 Cocaine          300 THC              50    Urine microscopic-add on     Status: Abnormal   Collection Time: 10/03/14 11:00 PM  Result Value Ref Range   Squamous Epithelial / LPF RARE RARE   WBC, UA 3-6 <3 WBC/hpf   Bacteria, UA FEW (A) RARE    Physical Findings: AIMS:  , ,  ,  ,    CIWA:    COWS:      See Psychiatric Specialty Exam and Suicide Risk Assessment completed by Attending Physician prior to discharge.  Discharge destination:  Home  Is patient on multiple antipsychotic therapies at discharge:  No   Has Patient had three or more failed trials of antipsychotic monotherapy by history:  No    Recommended Plan for Multiple Antipsychotic Therapies: NA     Medication List    STOP taking  these medications        multivitamins ther. w/minerals Tabs tablet      TAKE these medications      Indication   nitrofurantoin (macrocrystal-monohydrate) 100 MG capsule  Commonly known as:  MACROBID  Take 1 capsule (100 mg total) by mouth every 12 (twelve) hours.   Indication:  Urinary Tract Infection     traZODone 50 MG tablet  Commonly known as:  DESYREL  Take 1 tablet (50 mg total) by mouth at bedtime as needed for sleep.   Indication:  Trouble Sleeping         Follow-up recommendations:  Activity:  As tolerated  Diet:  Heart healthy with low sodium.  Comments:  Take all medications as prescribed. Keep all follow-up appointments as scheduled.  Do not consume alcohol or use illegal drugs while on prescription medications. Report any adverse effects from your medications to your primary care provider promptly.  In the event of recurrent symptoms or worsening symptoms, call 911, a crisis hotline, or go to the nearest emergency department for evaluation.   Total Discharge Time:     Greater than 30 minutes  Signed: Benjamine Mola, FNP-BC 10/05/2014, 9:48 AM  I personally assessed the patient and formulated the plan Geralyn Flash A. Sabra Heck, M.D.

## 2014-10-05 NOTE — [facility] Counselor (Signed)
Adult Comprehensive Assessment  Patient ID: Tiffany Monroe, female   DOB: 1976-01-07, 39 y.o.   MRN: [id number]  Information Source:  Pt schedule for d/c within 24 hours of admission. No PSA required. She plans to go to [place] with family at [place]. Her husband will pick her up.     Smart, [place] LCSWA 10/05/2014

## 2014-10-05 NOTE — Tx Team (Signed)
Interdisciplinary Treatment Plan Update (Adult)  Date:  10/05/2014  Time Reviewed:  8:07 AM   Progress in Treatment: Attending groups: Yes. Participating in groups:  Yes. Taking medication as prescribed:  Yes. Tolerating medication:  Yes. Family/Significant othe contact made:  Not yet. spe required for this pt.  Patient understands diagnosis:  Yes. and As evidenced by:  seeking treatment for depression Discussing patient identified problems/goals with staff:  Yes. Medical problems stabilized or resolved:  Yes. Denies suicidal/homicidal ideation: Yes. Issues/concerns per patient self-inventory: n/a  Other:  New problem(s) identified:  None   Discharge Plan or Barriers: Pt is scheduled for d/c today and plans to go to [place] with family. She does not want followup but agreed to allow CSW to find outpatient clinic in local area in [place] in case she changes her mind.   Reason for Continuation of Hospitalization: none  Comments:  Tiffany Monroe is an 39 y.o.married female who was brought to the [facility] by EMS after ingesting RAID bug killer and drinking one bottle of wine. Patient reported ingesting these substances as a suicide attempt. Her stressor is fidelity problems in her marriage. During assessment at [facility] she reported history of many suicide attempts. Also had talked about seeing a girl everyday who tells her to harm self. She endorsed multiple depressive symptoms such as sadness, fatigue, guilt, tearfulness, isolating, lack of motivation, and poor sleep.However, she denies any past suicide attempts and appears to minimize her symptoms. Patient is hoping to be discharged tomorrow. The patient stated "I got angry with my husband. I came back from [place] because I received some medicine for my kidneys. I had gotten an infection after my husband gave me chlamydia. He has been texting many women and I have proof. I also saw the medicine bottles that indicate he might have another STD. I am  just ready to go. I was drinking wine because I was upset. He kept saying mean things to me. I put some Raid in my hand and drank it. The bottle was mostly empty. Then my husband called 14. I am just done with him. I had plans to go to [place] and live with family. I was even looking for jobs up there. I did not intend to come here. I was not really trying to kill myself. He was just causing me so much pain. I have never been in a mental hospital or seen a Psychiatrist. I generally function pretty well but sometimes get depressed. Generally I am happy and enjoy doing things for people. I do not deserve this kind of treatment. I do not want to kill myself. I have never been on any psychiatric medications. I did talk to a Doristine Bosworth when I was in [place] because I was upset about my husband. I really need to get on my way. I am not feeling sick at all from the stuff I consumed.' Denies any regular pattern of alcohol use and does not appear to be in withdrawal. Her vitals signs are found to be stable. She does not appear to be responding to internal stimuli. She denies any intent to harm her husband. Also reports not feeling in any danger from husband due to her intent to leave the relationship.   Estimated length of stay: d/c today per Dr. Sabra Heck    New goal(s): to formulate aftercare plan.   Additional Comments:  Patient and CSW reviewed pt's identified goals and treatment plan. Patient verbalized understanding and agreed to treatment plan. CSW  reviewed [facility] "Discharge Process and Patient Involvement" Form. Pt verbalized understanding of information provided and signed form.    Review of initial/current patient goals per problem list:  1. Goal(s): Patient will participate in aftercare plan  Met: Yes   Target date: at discharge  As evidenced by: Patient will participate within aftercare plan AEB aftercare provider and housing plan at discharge being identified.  8/10: Pt to move to [place] with family and  follow-up if necessary at local mental health clinic.   2. Goal (s): Patient will exhibit decreased depressive symptoms and suicidal ideations.  Met: Yes    Target date: at discharge  As evidenced by: Patient will utilize self rating of depression at 3 or below and demonstrate decreased signs of depression or be deemed stable for discharge by MD.  8/10: Pt rates depression as 2/10 and presents with pleasant mood and calm affect. Pt denies SI/Hi/AVH.   3. Goal(s): Patient will demonstrate decreased signs of withdrawal due to substance abuse  Met:Yes   Target date:at discharge   As evidenced by: Patient will produce a CIWA/COWS score of 0, have stable vitals signs, and no symptoms of withdrawal.  8/10: Pt reports no signs of withdrawal with stable vitals.   Attendees: Patient:   10/05/2014 8:07 AM   Family:   10/05/2014 8:07 AM   Physician:  Dr. Carlton Adam, MD 10/05/2014 8:07 AM   Nursing:   Larry Sierras; Christa RN 10/05/2014 8:07 AM   Clinical Social Worker: Maxie Better, [place]  10/05/2014 8:07 AM   Clinical Social Worker: Erasmo Downer Drinkard LCSWA; Peri Maris LCSWA 10/05/2014 8:07 AM   Other:  Gerline Legacy Nurse Case Monroe 10/05/2014 8:07 AM   Other:  Lucinda Dell; [facility]  10/05/2014 8:07 AM   Other:   10/05/2014 8:07 AM   Other:  10/05/2014 8:07 AM   Other:  10/05/2014 8:07 AM   Other:  10/05/2014 8:07 AM    10/05/2014 8:07 AM    10/05/2014 8:07 AM    10/05/2014 8:07 AM    10/05/2014 8:07 AM    Scribe for Treatment Team:   Maxie Better, [facility]  10/05/2014 8:07 AM

## 2014-10-05 NOTE — Progress Notes (Signed)
  [facility] Adult Case Management Discharge Plan :  Will you be returning to the same living situation after discharge:  No. pt moving to [place] with family at d/c. At discharge, do you have transportation home?: Yes,  husband will take her home to pack her things. Pt stated that this is a safe plan for her.  Do you have the ability to pay for your medications: Yes,  mental health  Release of information consent forms completed and submitted to medical records by CSW.  Patient to Follow up at: Follow-up Information    Follow up with [facility].   Why:  Walk-in or call office if you would like to be seen for outpatient mental health/behavioral health services. (Patient declined follow-up appt).    Contact information:   [address] [place], [place] [address] Phone: [phone] Fax: [phone]      Patient denies SI/HI: Yes,  during group/self report.     Safety Planning and Suicide Prevention discussed: Yes,  SPE completed with pt, as she refused to allow family contact. SPI pamphlet and mobile crisis info also provided to pt.   Have you used any form of tobacco in the last 30 days? (Cigarettes, Smokeless Tobacco, Cigars, and/or Pipes): No  Has patient been referred to the Quitline?: N/A patient is not a smoker  Smart, [employer]  10/05/2014, 10:51 AM

## 2014-10-05 NOTE — BHH Group Notes (Signed)
[facility] LCSW Aftercare Discharge Planning Group Note   10/05/2014 8:46 AM  Participation Quality:  Appropriate   Mood/Affect:  Appropriate  Depression Rating:  2  Anxiety Rating:  0  Thoughts of Suicide:  No Will you contract for safety?   NA  Current AVH:  No  Plan for Discharge/Comments:  Pt plans to d/c today per Dr. Dub Mikes. She plans to go to [place] at discharge with family pending job opportunity and is leaving her husband due to his infedility. Pt has no followup in place and does not want follow-up but agreed to allow CSW to look up local mental health walk in clinic in area where she will be living.   Transportation Means: husband   Supports: family in [place]  Smart, [place]

## 2014-10-05 NOTE — BHH Suicide Risk Assessment (Signed)
[facility] Discharge Suicide Risk Assessment   Demographic Factors:  NA  Total Time spent with patient: 30 minutes  Musculoskeletal: Strength & Muscle Tone: within normal limits Gait & Station: normal Patient leans: normal  Psychiatric Specialty Exam: Physical Exam  Review of Systems  Constitutional: Negative.   HENT: Negative.   Eyes: Negative.   Respiratory: Negative.   Cardiovascular: Negative.   Gastrointestinal: Negative.   Genitourinary: Negative.   Musculoskeletal: Negative.   Skin: Negative.   Neurological: Negative.   Endo/Heme/Allergies: Negative.   Psychiatric/Behavioral: Negative.     Blood pressure 97/63, pulse 71, temperature 97.5 F (36.4 C), temperature source Oral, resp. rate 18, height 5' (1.524 m), weight 52.164 kg (115 lb), last menstrual period 10/03/2014, SpO2 99 %.Body mass index is 22.46 kg/(m^2).  General Appearance: Fairly Groomed  Patent attorney::  Fair  Speech:  Clear and Coherent409  Volume:  Normal  Mood:  Euthymic  Affect:  Appropriate  Thought Process:  Coherent and Goal Directed  Orientation:  Full (Time, Place, and Person)  Thought Content:  plans as she moves on  Suicidal Thoughts:  No  Homicidal Thoughts:  No  Memory:  Immediate;   Fair Recent;   Fair Remote;   Fair  Judgement:  Fair  Insight:  Present  Psychomotor Activity:  Normal  Concentration:  Fair  Recall:  [employer] of Knowledge:Fair  Language: Fair  Akathisia:  No  Handed:  Right  AIMS (if indicated):     Assets:  Desire for Improvement Housing  Sleep:  Number of Hours: 6.5  Cognition: WNL  ADL's:  Intact   Have you used any form of tobacco in the last 30 days? (Cigarettes, Smokeless Tobacco, Cigars, and/or Pipes): No  Has this patient used any form of tobacco in the last 30 days? (Cigarettes, Smokeless Tobacco, Cigars, and/or Pipes) No  Mental Status Per Nursing Assessment::   On Admission:  Self-harm behaviors  Current Mental Status by Physician: In full contact  with reality. There are no active SI plans or intent. She states her husband is going to pick her up. She is still planning to go to [place]. Her sister and other relatives are going to be there. She is in the process of getting a job in [place]. It it was going to take longer to be able to get there she is planning to enroll in [employer] and pursue IT classes. States her husband knows that even she if stays here longer the relationship is over. She admits that if she would not have drank the wine she would probably not have done what she did   Loss Factors: Loss of significant relationship  Historical Factors: NA  Risk Reduction Factors:   Responsible for children under 70 years of age, Sense of responsibility to family, Living with another person, especially a relative and Positive social support  Continued Clinical Symptoms:  Depression:   Impulsivity  Cognitive Features That Contribute To Risk:  None    Suicide Risk:  Minimal: No identifiable suicidal ideation.  Patients presenting with no risk factors but with morbid ruminations; may be classified as minimal risk based on the severity of the depressive symptoms  Principal Problem: Adjustment disorder with mixed anxiety and depressed mood Discharge Diagnoses:  Patient Active Problem List   Diagnosis Date Noted  . Suicide attempt [T14.91] 10/04/2014  . Adjustment disorder with mixed anxiety and depressed mood [F43.23] 10/04/2014  . HYPONATREMIA [E87.1] 09/15/2009  . PYELONEPHRITIS [N12] 08/30/2009  . LIVER  FUNCTION TESTS, ABNORMAL, HX OF [Z86.39] 08/30/2009    Follow-up Information    Follow up with [facility].   Why:  Walk-in or call office if you would like to be seen for outpatient mental health/behavioral health services. (Patient declined follow-up appt).    Contact information:   [address] [place], [place] [address] Phone: [phone] Fax: [phone]      Plan Of  Care/Follow-up recommendations:  Activity:  as tolerated Diet:  regular Follow up as above Is patient on multiple antipsychotic therapies at discharge:  No   Has Patient had three or more failed trials of antipsychotic monotherapy by history:  No  Recommended Plan for Multiple Antipsychotic Therapies: NA    Tiffany Monroe A 10/05/2014, 1:06 PM

## 2014-10-05 NOTE — Progress Notes (Signed)
D/C instructions/meds/follow-up appointments reviewed, pt verbalized understanding, pt's belongings returned to pt, samples given, denies SI/HI/AVH 

## 2014-10-05 NOTE — Progress Notes (Signed)
Recreation Therapy Notes  Date: 08.10.16 Time: 930 am Location: 300 Hall Group Room  Group Topic: Stress Management  Goal Area(s) Addresses:  Patient will verbalize importance of using healthy stress management.  Patient will identify positive emotions associated with healthy stress management.   Behavioral Response:  Engaged  Intervention: Stress Management  Activity : Progressive Muscle Relaxation. LRT will introduce and instruct patients on the stress management technique of progressive muscle relaxation. Patients were asked to follow a long with a script read a loud by LRT to participate in the stress management technique of progressive muscle relaxation.  Education: Stress Management, Discharge Planning.   Education Outcome: Acknowledges edcuation/In group clarification offered/Needs additional education  Clinical Observations/Feedback: Patient attended the group.    Caroll Rancher, LRT/CTRS         Caroll Rancher A 10/05/2014 3:47 PM

## 2014-10-05 NOTE — BHH Suicide Risk Assessment (Signed)
BHH INPATIENT:  Family/Significant Other Suicide Prevention Education  Suicide Prevention Education:  Patient Refusal for Family/Significant Other Suicide Prevention Education: The patient Sharetha Newson has refused to provide written consent for family/significant other to be provided Family/Significant Other Suicide Prevention Education during admission and/or prior to discharge.  Physician notified.  SPE completed with pt, as pt refused to consent to family contact. SPI pamphlet provided to pt and pt was encouraged to share information with support network, ask questions, and talk about any concerns relating to SPE. Pt denies access to guns/firearms and verbalized understanding of information provided. Mobile Crisis information also provided to pt.    *PT continues to deny that incident that led to her admission was suicide attempt. Pt denies SI/HI/AVH and reports that she does not have access to guns/firearms.   Smart, Dink Creps LCSWA 10/05/2014, 10:38 AM

## 2014-10-08 ENCOUNTER — Emergency Department (HOSPITAL_COMMUNITY)
Admission: EM | Admit: 2014-10-08 | Discharge: 2014-10-09 | Disposition: A | Discharge status: Home / Self Care | Payer: Self-pay | Attending: Emergency Medicine | Admitting: Emergency Medicine

## 2014-10-08 DIAGNOSIS — N739 Female pelvic inflammatory disease, unspecified: Secondary | ICD-10-CM | POA: Insufficient documentation

## 2014-10-08 DIAGNOSIS — Z792 Long term (current) use of antibiotics: Secondary | ICD-10-CM | POA: Insufficient documentation

## 2014-10-08 DIAGNOSIS — Z87442 Personal history of urinary calculi: Secondary | ICD-10-CM | POA: Insufficient documentation

## 2014-10-08 DIAGNOSIS — Z3202 Encounter for pregnancy test, result negative: Secondary | ICD-10-CM | POA: Insufficient documentation

## 2014-10-08 DIAGNOSIS — N730 Acute parametritis and pelvic cellulitis: Secondary | ICD-10-CM

## 2014-10-08 DIAGNOSIS — Z880 Allergy status to penicillin: Secondary | ICD-10-CM | POA: Insufficient documentation

## 2014-10-08 DIAGNOSIS — Z87440 Personal history of urinary (tract) infections: Secondary | ICD-10-CM | POA: Insufficient documentation

## 2014-10-08 DIAGNOSIS — Z79899 Other long term (current) drug therapy: Secondary | ICD-10-CM | POA: Insufficient documentation

## 2014-10-09 ENCOUNTER — Encounter (HOSPITAL_COMMUNITY): Payer: Self-pay | Admitting: Oncology

## 2014-10-09 LAB — WET PREP, GENITAL
Clue Cells Wet Prep HPF POC: NONE SEEN
Trich, Wet Prep: NONE SEEN
Yeast Wet Prep HPF POC: NONE SEEN

## 2014-10-09 LAB — POC URINE PREG, ED: Preg Test, Ur: NEGATIVE

## 2014-10-09 LAB — URINALYSIS, ROUTINE W REFLEX MICROSCOPIC
Bilirubin Urine: NEGATIVE
Glucose, UA: NEGATIVE mg/dL
Hgb urine dipstick: NEGATIVE
Ketones, ur: NEGATIVE mg/dL
Leukocytes, UA: NEGATIVE
Nitrite: NEGATIVE
PH: 7 (ref 5.0–8.0)
Protein, ur: NEGATIVE mg/dL
Specific Gravity, Urine: 1.023 (ref 1.005–1.030)
Urobilinogen, UA: 1 mg/dL (ref 0.0–1.0)

## 2014-10-09 MED ORDER — DOXYCYCLINE HYCLATE 100 MG PO CAPS: 100.0000 mg | ORAL_CAPSULE | Freq: Two times a day (BID) | ORAL | Status: AC

## 2014-10-09 MED ORDER — STERILE WATER FOR INJECTION IJ SOLN
INTRAMUSCULAR | Status: AC
  Filled 2014-10-09: qty 10

## 2014-10-09 MED ORDER — IBUPROFEN 200 MG PO TABS
600.0000 mg | ORAL_TABLET | Freq: Once | ORAL | Status: AC
  Administered 2014-10-09: 600 mg via ORAL
  Filled 2014-10-09: qty 3

## 2014-10-09 MED ORDER — IBUPROFEN 400 MG PO TABS: 400.0000 mg | ORAL_TABLET | Freq: Four times a day (QID) | ORAL | Status: AC | PRN

## 2014-10-09 MED ORDER — AZITHROMYCIN 250 MG PO TABS
1000.0000 mg | ORAL_TABLET | Freq: Once | ORAL | Status: AC
  Administered 2014-10-09: 1000 mg via ORAL
  Filled 2014-10-09: qty 4

## 2014-10-09 MED ORDER — CEFTRIAXONE SODIUM 250 MG IJ SOLR
250.0000 mg | Freq: Once | INTRAMUSCULAR | Status: AC
  Administered 2014-10-09: 250 mg via INTRAMUSCULAR
  Filled 2014-10-09: qty 250

## 2014-10-09 NOTE — ED Notes (Signed)
Pelvic being done at this time

## 2014-10-09 NOTE — ED Provider Notes (Signed)
CSN: [id number]   Arrival date & time 10/08/14 2340  History  This chart was scribed for  Derwood Kaplan, MD by Bethel Born, ED Scribe. This patient was seen in room WA06/WA06 and the patient's care was started at 1:25 AM.  Chief Complaint  Patient presents with  . Flank Pain    HPI The history is provided by the patient. No language interpreter was used.   Tiffany Monroe is a 39 y.o. female with PMHx of kidney stones and pyelonephritis who presents to the Emergency Department complaining of constant bilateral flank pain (L>R) with gradual onset yesterday. She describes the pain as sharp and rates it 9/10 in severity. The pain radiates to the left side of the abdomen. She notes some increased warmth at her right flank. Associated symptoms include nausea and mild headache. She started Macrobid 4 days ago for a UTI. At that time she had increased urinary frequency and urgency. Pt denies dysuria,chills, fever, vomiting, abnormal vaginal discharge, and hematuria. No history of pelvic disorders.   UA shoes no pyuria, and so on further questioning pt reports that her soon to be ex-husband was sleeping around with multiple women and that he had acquired STD. Pt was having unprotected intercourse with her husband. She also has thick discharge.  Past Medical History  Diagnosis Date  . UTI (urinary tract infection)   . H/O: suicide attempt 01/2014    attempted overdose on Tylenol    History reviewed. No pertinent past surgical history.  No family history on file.  Social History  Substance Use Topics  . Smoking status: Never Smoker   . Smokeless tobacco: None  . Alcohol Use: Yes     Review of Systems  Constitutional: Negative for fever and chills.  Gastrointestinal: Positive for nausea and abdominal pain. Negative for vomiting.  Genitourinary: Positive for urgency, frequency, flank pain and vaginal discharge. Negative for dysuria, hematuria and vaginal bleeding.   Musculoskeletal: Positive for back pain.  Allergic/Immunologic: Negative for immunocompromised state.     Home Medications   Prior to Admission medications   Medication Sig Start Date End Date Taking? Authorizing Provider  B Complex-C (B-COMPLEX WITH VITAMIN C) tablet Take 1 tablet by mouth daily.   Yes Historical Provider, MD  Multiple Vitamin (MULTI-VITAMINS) TABS Take 1 tablet by mouth daily.   Yes Historical Provider, MD  nitrofurantoin, macrocrystal-monohydrate, (MACROBID) 100 MG capsule Take 1 capsule (100 mg total) by mouth every 12 (twelve) hours. 10/05/14  Yes Beau Fanny, FNP  doxycycline (VIBRAMYCIN) 100 MG capsule Take 1 capsule (100 mg total) by mouth 2 (two) times daily. 10/09/14   Derwood Kaplan, MD  ibuprofen (ADVIL,MOTRIN) 400 MG tablet Take 1 tablet (400 mg total) by mouth every 6 (six) hours as needed. 10/09/14   Derwood Kaplan, MD  traZODone (DESYREL) 50 MG tablet Take 1 tablet (50 mg total) by mouth at bedtime as needed for sleep. 10/05/14   Beau Fanny, FNP    Allergies  Peanut-containing drug products and Penicillins  Triage Vitals: BP 106/69 mmHg  Pulse 71  Temp(Src) 98.1 F (36.7 C) (Oral)  Resp 20  Ht  (1.549 m)  Wt 115 lb (52.164 kg)  BMI 21.74 kg/m2  SpO2 99%  LMP 10/03/2014  Physical Exam  Constitutional: She is oriented to person, place, and time. She appears well-developed and well-nourished.  HENT:  Head: Normocephalic and atraumatic.  Eyes: Conjunctivae and EOM are normal. Pupils are equal, round, and reactive to light.  Neck: Normal  range of motion. Neck supple.  Cardiovascular: Normal rate, regular rhythm, normal heart sounds and intact distal pulses.  Exam reveals no gallop and no friction rub.   No murmur heard. Pulmonary/Chest: Effort normal. No respiratory distress. She has no wheezes.  Lungs CTAB  Abdominal: Soft. Bowel sounds are normal. She exhibits no distension. There is no tenderness. There is no rebound, no guarding and  negative Murphy's sign.  Bilateral flank tenderness (L>R) Diffuse abdominal tenderness with voluntary guarding No rebound  Genitourinary: Vagina normal and uterus normal.  External exam - normal, no lesions Speculum exam: Pt has some thick, yellow discharge, no blood Bimanual exam: Patient has + CMT, no adnexal tenderness or fullness and cervical os is closed  Musculoskeletal: Normal range of motion.  Neurological: She is alert and oriented to person, place, and time.  Skin: Skin is warm and dry.  Psychiatric: She has a normal mood and affect.  Nursing note and vitals reviewed.   ED Course  Procedures   DIAGNOSTIC STUDIES: Oxygen Saturation is 99% on RA, normal by my interpretation.    COORDINATION OF CARE: 1:33 AM Discussed treatment plan which includes lab work with pt at bedside and pt agreed to plan.  3:01 AM I re-evaluated the patient and provided an update on the plan for a pelvic exam.  Labs Review-  Labs Reviewed  WET PREP, GENITAL - Abnormal; Notable for the following:    WBC, Wet Prep HPF POC MANY (*)    All other components within normal limits  URINALYSIS, ROUTINE W REFLEX MICROSCOPIC (NOT AT [facility]) - Abnormal; Notable for the following:    APPearance CLOUDY (*)    All other components within normal limits  URINE CULTURE  POC URINE PREG, ED  GC/CHLAMYDIA PROBE AMP ([place]) NOT AT [facility]    Imaging Review No results found.  EKG Interpretation None      MDM   Final diagnoses:  PID (acute pelvic inflammatory disease)     Pt comes in with abd pain and back pain. She i sbeing treated as uti  -on 5th day of macrobid, and her sx have gotten worse. She had moderate cervical motion tenderness and thick discharge, and the hx is concerning for possible PID. Will tx for gc and chlamydia.  Will start her on doxy. She has 0 SIRS at arrival, non toxic, and based on the exam i doubt that there is a TOA. Return precautions discussed.     Derwood Kaplan,  MD 10/09/14 [phone]

## 2014-10-09 NOTE — ED Notes (Signed)
Pt recently dx w/ UTI on antibiotics currently.  Pt presents d/t b/l flank pain 9/10.

## 2014-10-09 NOTE — ED Notes (Signed)
Pt in bathroom obtaining urine sample. 

## 2014-10-09 NOTE — Discharge Instructions (Signed)
Pelvic Inflammatory Disease °Pelvic inflammatory disease (PID) refers to an infection in some or all of the female organs. The infection can be in the uterus, ovaries, fallopian tubes, or the surrounding tissues in the pelvis. PID can cause abdominal or pelvic pain that comes on suddenly (acute pelvic pain). PID is a serious infection because it can lead to lasting (chronic) pelvic pain or the inability to have children (infertile).  °CAUSES  °The infection is often caused by the normal bacteria found in the vaginal tissues. PID may also be caused by an infection that is spread during sexual contact. PID can also occur following:  °· The birth of a baby.   °· A miscarriage.   °· An abortion.   °· Major pelvic surgery.   °· The use of an intrauterine device (IUD).   °· A sexual assault.   °RISK FACTORS °Certain factors can put a person at higher risk for PID, such as: °· Being younger than 25 years. °· Being sexually active at a young age. °· Using nonbarrier contraception. °· Having multiple sexual partners. °· Having sex with someone who has symptoms of a genital infection. °· Using oral contraception. °Other times, certain behaviors can increase the possibility of getting PID, such as: °· Having sex during your period. °· Using a vaginal douche. °· Having an intrauterine device (IUD) in place. °SYMPTOMS  °· Abdominal or pelvic pain.   °· Fever.   °· Chills.   °· Abnormal vaginal discharge. °· Abnormal uterine bleeding.   °· Unusual pain shortly after finishing your period. °DIAGNOSIS  °Your caregiver will choose some of the following methods to make a diagnosis, such as:  °· Performing a physical exam and history. A pelvic exam typically reveals a very tender uterus and surrounding pelvis.   °· Ordering laboratory tests including a pregnancy test, blood tests, and urine test.  °· Ordering cultures of the vagina and cervix to check for a sexually transmitted infection (STI). °· Performing an ultrasound.    °· Performing a laparoscopic procedure to look inside the pelvis.   °TREATMENT  °· Antibiotic medicines may be prescribed and taken by mouth.   °· Sexual partners may be treated when the infection is caused by a sexually transmitted disease (STD).   °· Hospitalization may be needed to give antibiotics intravenously. °· Surgery may be needed, but this is rare. °It may take weeks until you are completely well. If you are diagnosed with PID, you should also be checked for human immunodeficiency virus (HIV).   °HOME CARE INSTRUCTIONS  °· If given, take your antibiotics as directed. Finish the medicine even if you start to feel better.   °· Only take over-the-counter or prescription medicines for pain, discomfort, or fever as directed by your caregiver.   °· Do not have sexual intercourse until treatment is completed or as directed by your caregiver. If PID is confirmed, your recent sexual partner(s) will need treatment.   °· Keep your follow-up appointments. °SEEK MEDICAL CARE IF:  °· You have increased or abnormal vaginal discharge.   °· You need prescription medicine for your pain.   °· You vomit.   °· You cannot take your medicines.   °· Your partner has an STD.   °SEEK IMMEDIATE MEDICAL CARE IF:  °· You have a fever.   °· You have increased abdominal or pelvic pain.   °· You have chills.   °· You have pain when you urinate.   °· You are not better after 72 hours following treatment.   °MAKE SURE YOU:  °· Understand these instructions. °· Will watch your condition. °· Will get help right away if you are not doing well or get worse. °  Document Released: 02/11/2005 Document Revised: 06/08/2012 Document Reviewed: 02/07/2011 °[employer]® Patient Information ©2015 [employer]. This information is not intended to replace advice given to you by your health care provider. Make sure you discuss any questions you have with your health care provider. ° °

## 2014-10-10 LAB — GC/CHLAMYDIA PROBE AMP (~~LOC~~) NOT AT ARMC
Chlamydia: NEGATIVE
Neisseria Gonorrhea: NEGATIVE

## 2014-10-10 LAB — URINE CULTURE: Culture: NO GROWTH
